# Patient Record
Sex: Male | Born: 1950
Health system: Southern US, Community
[De-identification: ages and names within clinical notes are randomized; demographics above are authoritative.]

## PROBLEM LIST (undated history)

## (undated) DIAGNOSIS — R7303 Prediabetes: Secondary | ICD-10-CM

## (undated) DIAGNOSIS — G5600 Carpal tunnel syndrome, unspecified upper limb: Secondary | ICD-10-CM

## (undated) DIAGNOSIS — E119 Type 2 diabetes mellitus without complications: Secondary | ICD-10-CM

## (undated) DIAGNOSIS — I10 Essential (primary) hypertension: Secondary | ICD-10-CM

## (undated) HISTORY — DX: Type 2 diabetes mellitus without complications: E11.9

---

## 2002-02-19 ENCOUNTER — Emergency Department (HOSPITAL_COMMUNITY): Admission: EM | Admit: 2002-02-19 | Discharge: 2002-02-19 | Payer: Self-pay | Admitting: Emergency Medicine

## 2003-07-28 ENCOUNTER — Ambulatory Visit (HOSPITAL_COMMUNITY): Admission: RE | Admit: 2003-07-28 | Discharge: 2003-07-28 | Payer: Self-pay | Admitting: Chiropractic Medicine

## 2006-02-18 HISTORY — PX: COLONOSCOPY: SHX174

## 2006-05-20 ENCOUNTER — Ambulatory Visit: Payer: Self-pay | Admitting: Gastroenterology

## 2006-06-03 ENCOUNTER — Ambulatory Visit: Payer: Self-pay | Admitting: Gastroenterology

## 2010-07-23 ENCOUNTER — Ambulatory Visit: Payer: Self-pay | Admitting: Gastroenterology

## 2010-10-13 ENCOUNTER — Encounter: Payer: Self-pay | Admitting: *Deleted

## 2010-10-13 DIAGNOSIS — M436 Torticollis: Secondary | ICD-10-CM | POA: Insufficient documentation

## 2010-10-13 DIAGNOSIS — M62838 Other muscle spasm: Secondary | ICD-10-CM | POA: Insufficient documentation

## 2010-10-13 NOTE — ED Notes (Signed)
Pt states that he went to a movie and came out, then his neck began to stiffen up and hurt. Now states he is having a hard "time swallowing". Denies recent illnesses

## 2010-10-14 ENCOUNTER — Emergency Department (INDEPENDENT_AMBULATORY_CARE_PROVIDER_SITE_OTHER): Payer: 59

## 2010-10-14 ENCOUNTER — Emergency Department (HOSPITAL_BASED_OUTPATIENT_CLINIC_OR_DEPARTMENT_OTHER)
Admission: EM | Admit: 2010-10-14 | Discharge: 2010-10-14 | Disposition: A | Discharge status: Home / Self Care | Payer: 59 | Attending: Emergency Medicine | Admitting: Emergency Medicine

## 2010-10-14 ENCOUNTER — Encounter (HOSPITAL_BASED_OUTPATIENT_CLINIC_OR_DEPARTMENT_OTHER): Payer: Self-pay | Admitting: *Deleted

## 2010-10-14 DIAGNOSIS — E119 Type 2 diabetes mellitus without complications: Secondary | ICD-10-CM

## 2010-10-14 DIAGNOSIS — M542 Cervicalgia: Secondary | ICD-10-CM

## 2010-10-14 DIAGNOSIS — M436 Torticollis: Secondary | ICD-10-CM

## 2010-10-14 DIAGNOSIS — J329 Chronic sinusitis, unspecified: Secondary | ICD-10-CM

## 2010-10-14 DIAGNOSIS — R930 Abnormal findings on diagnostic imaging of skull and head, not elsewhere classified: Secondary | ICD-10-CM

## 2010-10-14 DIAGNOSIS — M62838 Other muscle spasm: Secondary | ICD-10-CM

## 2010-10-14 HISTORY — DX: Prediabetes: R73.03

## 2010-10-14 LAB — BASIC METABOLIC PANEL
GFR calc non Af Amer: >60 mL/min (ref >60)
Glucose, Bld: 198 mg/dL — ABNORMAL HIGH (ref 70–99)
Potassium: 3.7 mEq/L (ref 3.5–5.1)

## 2010-10-14 MED ORDER — DIAZEPAM 5 MG PO TABS: 5.0000 mg | ORAL_TABLET | Freq: Two times a day (BID) | ORAL | Status: AC

## 2010-10-14 MED ORDER — IOHEXOL 300 MG/ML  SOLN
100.0000 mL | Freq: Once | INTRAMUSCULAR | Status: AC | PRN
  Administered 2010-10-14: 100 mL via INTRAVENOUS

## 2010-10-14 NOTE — ED Provider Notes (Addendum)
History     CSN: [id number] Arrival date & time: 10/14/2010  1:36 AM  Chief Complaint  Patient presents with  . Torticollis   HPI Comments: Pt went to a movie, soon after developed "pressure/stiffness" in his occiput and upper neck.  Reports pain is mild, and not sudden onset Denies trauma to neck No focal weakness. No difficulty speaking/swallowing Denies fever No visual changes  Patient is a 60 y.o. male presenting with headaches.  Headache  This is a new problem. The current episode started 1 to 2 hours ago. The problem occurs constantly. The pain is located in the occipital region. The quality of the pain is described as throbbing. The pain is mild. Radiates to: upper neck. Pertinent negatives include no anorexia, no fever, no malaise/fatigue, no chest pressure, no near-syncope, no syncope, no shortness of breath, no nausea and no vomiting. Associated symptoms comments: Denies cp/sob/weakness/visual changes/hearing change/slurred speech No frontal HA reported .    Past Medical History  Diagnosis Date  . Pre-diabetes     No past surgical history on file.  No family history on file.  History  Substance Use Topics  . Smoking status: Never Smoker   . Smokeless tobacco: Not on file  . Alcohol Use: No      Review of Systems  Constitutional: Negative for fever and malaise/fatigue.  Respiratory: Negative for shortness of breath.   Cardiovascular: Negative for syncope and near-syncope.  Gastrointestinal: Negative for nausea, vomiting and anorexia.  Neurological: Positive for headaches.  All other systems reviewed and are negative.    Physical Exam  BP 142/86  Pulse 76  Temp(Src) 98.5 F (36.9 C) (Oral)  Resp 20  SpO2 100%  Physical Exam  CONSTITUTIONAL: Well developed/well nourished HEAD AND FACE: Normocephalic/atraumatic but tender at occiput, no bruising noted EYES: EOMI/PERRL ENMT: Mucous membranes moist, no trismus, NECK: pt is able to flex/extend neck  but has limited ROM looking to left/right,  no carotid bruit SPINE:tender at C1, no bruising/crepitance CV: S1/S2 noted, no murmurs/rubs/gallops noted LUNGS: Lungs are clear to auscultation bilaterally, no apparent distress ABDOMEN: soft, nontender, no rebound or guarding NEURO: Awake/alert, facies symmetric, no arm or leg drift is noted Cranial nerves 3/4/5/6/08/26/08/11/12 tested and intact EXTREMITIES: pulses normal, full ROM SKIN: warm, color normal PSYCH: no abnormalities of mood noted   ED Course  Procedures  MDM Nursing notes reviewed and considered in documentation All labs/vitals reviewed and considered  Pt with insidious onset of head/neck pain.  Unclear cause.  No neuro deficits, but given history, will image brain/neck, and in particular focus on neck vasculature.   3:41 AM  5:29 AM ct head negative and ct neck shows no obvious abnormalities per prelim read.  Pt still with neck stiffness, but no other new complaints     Pt instructed to hold his metformin for 48hours, he is agreeable with this plan   Joya Gaskins, MD 10/14/10 [id number]  Joya Gaskins, MD 10/14/10 [phone]

## 2014-06-07 ENCOUNTER — Emergency Department (HOSPITAL_BASED_OUTPATIENT_CLINIC_OR_DEPARTMENT_OTHER): Admission: EM | Admit: 2014-06-07 | Discharge: 2014-06-07 | Discharge status: Absent without leave | Payer: Self-pay

## 2014-06-07 ENCOUNTER — Encounter (HOSPITAL_BASED_OUTPATIENT_CLINIC_OR_DEPARTMENT_OTHER): Payer: Self-pay | Admitting: Emergency Medicine

## 2014-06-07 HISTORY — DX: Carpal tunnel syndrome, unspecified upper limb: G56.00

## 2014-06-07 HISTORY — DX: Essential (primary) hypertension: I10
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
## 2015-04-24 ENCOUNTER — Encounter (INDEPENDENT_AMBULATORY_CARE_PROVIDER_SITE_OTHER): Payer: BLUE CROSS/BLUE SHIELD | Admitting: Ophthalmology

## 2015-04-24 DIAGNOSIS — H43813 Vitreous degeneration, bilateral: Secondary | ICD-10-CM

## 2015-04-24 DIAGNOSIS — D3131 Benign neoplasm of right choroid: Secondary | ICD-10-CM | POA: Diagnosis not present

## 2015-04-24 DIAGNOSIS — H2513 Age-related nuclear cataract, bilateral: Secondary | ICD-10-CM

## 2016-04-17 ENCOUNTER — Encounter: Payer: Self-pay | Admitting: Gastroenterology

## 2016-10-16 ENCOUNTER — Encounter: Payer: Self-pay | Admitting: Internal Medicine

## 2018-04-08 ENCOUNTER — Encounter: Payer: Self-pay | Admitting: Gastroenterology

## 2018-05-13 ENCOUNTER — Encounter: Payer: Self-pay | Admitting: Gastroenterology

## 2018-05-22 ENCOUNTER — Encounter: Payer: Self-pay | Admitting: Gastroenterology

## 2019-03-17 ENCOUNTER — Ambulatory Visit: Payer: Self-pay

## 2019-03-26 ENCOUNTER — Ambulatory Visit: Payer: Medicare PPO | Attending: Internal Medicine

## 2019-03-26 DIAGNOSIS — Z23 Encounter for immunization: Secondary | ICD-10-CM | POA: Insufficient documentation

## 2019-03-26 NOTE — Progress Notes (Signed)
   Covid-19 Vaccination Clinic  Name:  Darryl Dunlap    MRN: [id number] DOB: 1950-04-20  03/26/2019  [name] was observed post Covid-19 immunization for 15 minutes without incidence. He was provided with Vaccine Information Sheet and instruction to access the V-Safe system.   [name] was instructed to call 911 with any severe reactions post vaccine: Marland Kitchen Difficulty breathing  . Swelling of your face and throat  . A fast heartbeat  . A bad rash all over your body  . Dizziness and weakness    Immunizations Administered    Name Date Dose VIS Date Route   Pfizer COVID-19 Vaccine 03/26/2019 10:17 AM 0.3 mL 01/29/2019 Intramuscular   Manufacturer: [place]   Lot: [id number]   [place]: [id number]

## 2019-04-03 ENCOUNTER — Ambulatory Visit: Payer: Self-pay

## 2019-04-20 ENCOUNTER — Ambulatory Visit: Payer: Medicare PPO | Attending: Internal Medicine

## 2019-04-20 DIAGNOSIS — Z23 Encounter for immunization: Secondary | ICD-10-CM | POA: Insufficient documentation

## 2019-04-20 NOTE — Progress Notes (Signed)
   Covid-19 Vaccination Clinic  Name:  Darryl Dunlap    MRN: [id number] DOB: 1950-06-12  04/20/2019  Darryl Dunlap was observed post Covid-19 immunization for 15 minutes without incident. He was provided with Vaccine Information Sheet and instruction to access the V-Safe system.   Darryl Dunlap was instructed to call 911 with any severe reactions post vaccine: Marland Kitchen Difficulty breathing  . Swelling of face and throat  . A fast heartbeat  . A bad rash all over body  . Dizziness and weakness   Immunizations Administered    Name Date Dose VIS Date Route   Pfizer COVID-19 Vaccine 04/20/2019 11:10 AM 0.3 mL 01/29/2019 Intramuscular   Manufacturer: [place]   Lot: [id number]   [place]: [id number]

## 2019-06-10 DIAGNOSIS — E1149 Type 2 diabetes mellitus with other diabetic neurological complication: Secondary | ICD-10-CM | POA: Diagnosis not present

## 2019-06-15 DIAGNOSIS — C44622 Squamous cell carcinoma of skin of right upper limb, including shoulder: Secondary | ICD-10-CM | POA: Diagnosis not present

## 2019-06-15 DIAGNOSIS — L820 Inflamed seborrheic keratosis: Secondary | ICD-10-CM | POA: Diagnosis not present

## 2019-06-15 DIAGNOSIS — D225 Melanocytic nevi of trunk: Secondary | ICD-10-CM | POA: Diagnosis not present

## 2019-06-15 DIAGNOSIS — D485 Neoplasm of uncertain behavior of skin: Secondary | ICD-10-CM | POA: Diagnosis not present

## 2019-06-15 DIAGNOSIS — Z1283 Encounter for screening for malignant neoplasm of skin: Secondary | ICD-10-CM | POA: Diagnosis not present

## 2019-06-16 DIAGNOSIS — L821 Other seborrheic keratosis: Secondary | ICD-10-CM | POA: Diagnosis not present

## 2019-06-16 DIAGNOSIS — D225 Melanocytic nevi of trunk: Secondary | ICD-10-CM | POA: Diagnosis not present

## 2019-06-16 DIAGNOSIS — C44622 Squamous cell carcinoma of skin of right upper limb, including shoulder: Secondary | ICD-10-CM | POA: Diagnosis not present

## 2019-07-09 DIAGNOSIS — D692 Other nonthrombocytopenic purpura: Secondary | ICD-10-CM | POA: Diagnosis not present

## 2019-07-09 DIAGNOSIS — Z1331 Encounter for screening for depression: Secondary | ICD-10-CM | POA: Diagnosis not present

## 2019-07-09 DIAGNOSIS — G629 Polyneuropathy, unspecified: Secondary | ICD-10-CM | POA: Diagnosis not present

## 2019-07-09 DIAGNOSIS — M47892 Other spondylosis, cervical region: Secondary | ICD-10-CM | POA: Diagnosis not present

## 2019-07-09 DIAGNOSIS — E7800 Pure hypercholesterolemia, unspecified: Secondary | ICD-10-CM | POA: Diagnosis not present

## 2019-07-09 DIAGNOSIS — E1149 Type 2 diabetes mellitus with other diabetic neurological complication: Secondary | ICD-10-CM | POA: Diagnosis not present

## 2019-07-09 DIAGNOSIS — Z794 Long term (current) use of insulin: Secondary | ICD-10-CM | POA: Diagnosis not present

## 2019-07-12 DIAGNOSIS — E1149 Type 2 diabetes mellitus with other diabetic neurological complication: Secondary | ICD-10-CM | POA: Diagnosis not present

## 2019-08-12 DIAGNOSIS — E1149 Type 2 diabetes mellitus with other diabetic neurological complication: Secondary | ICD-10-CM | POA: Diagnosis not present

## 2019-08-25 DIAGNOSIS — L570 Actinic keratosis: Secondary | ICD-10-CM | POA: Diagnosis not present

## 2019-08-25 DIAGNOSIS — L821 Other seborrheic keratosis: Secondary | ICD-10-CM | POA: Diagnosis not present

## 2019-08-25 DIAGNOSIS — Z08 Encounter for follow-up examination after completed treatment for malignant neoplasm: Secondary | ICD-10-CM | POA: Diagnosis not present

## 2019-08-25 DIAGNOSIS — X32XXXD Exposure to sunlight, subsequent encounter: Secondary | ICD-10-CM | POA: Diagnosis not present

## 2019-08-25 DIAGNOSIS — C44722 Squamous cell carcinoma of skin of right lower limb, including hip: Secondary | ICD-10-CM | POA: Diagnosis not present

## 2019-08-25 DIAGNOSIS — Z85828 Personal history of other malignant neoplasm of skin: Secondary | ICD-10-CM | POA: Diagnosis not present

## 2019-09-13 DIAGNOSIS — E1149 Type 2 diabetes mellitus with other diabetic neurological complication: Secondary | ICD-10-CM | POA: Diagnosis not present

## 2019-09-28 DIAGNOSIS — Z0000 Encounter for general adult medical examination without abnormal findings: Secondary | ICD-10-CM | POA: Diagnosis not present

## 2019-09-28 DIAGNOSIS — E1149 Type 2 diabetes mellitus with other diabetic neurological complication: Secondary | ICD-10-CM | POA: Diagnosis not present

## 2019-09-28 DIAGNOSIS — Z125 Encounter for screening for malignant neoplasm of prostate: Secondary | ICD-10-CM | POA: Diagnosis not present

## 2019-09-28 DIAGNOSIS — E7800 Pure hypercholesterolemia, unspecified: Secondary | ICD-10-CM | POA: Diagnosis not present

## 2019-10-05 DIAGNOSIS — Z0000 Encounter for general adult medical examination without abnormal findings: Secondary | ICD-10-CM | POA: Diagnosis not present

## 2019-10-05 DIAGNOSIS — D692 Other nonthrombocytopenic purpura: Secondary | ICD-10-CM | POA: Diagnosis not present

## 2019-10-05 DIAGNOSIS — Z794 Long term (current) use of insulin: Secondary | ICD-10-CM | POA: Diagnosis not present

## 2019-10-05 DIAGNOSIS — R82998 Other abnormal findings in urine: Secondary | ICD-10-CM | POA: Diagnosis not present

## 2019-10-05 DIAGNOSIS — Z1212 Encounter for screening for malignant neoplasm of rectum: Secondary | ICD-10-CM | POA: Diagnosis not present

## 2019-10-05 DIAGNOSIS — E7800 Pure hypercholesterolemia, unspecified: Secondary | ICD-10-CM | POA: Diagnosis not present

## 2019-10-05 DIAGNOSIS — E1149 Type 2 diabetes mellitus with other diabetic neurological complication: Secondary | ICD-10-CM | POA: Diagnosis not present

## 2019-10-05 DIAGNOSIS — M47892 Other spondylosis, cervical region: Secondary | ICD-10-CM | POA: Diagnosis not present

## 2019-10-05 DIAGNOSIS — G629 Polyneuropathy, unspecified: Secondary | ICD-10-CM | POA: Diagnosis not present

## 2019-10-14 DIAGNOSIS — E1149 Type 2 diabetes mellitus with other diabetic neurological complication: Secondary | ICD-10-CM | POA: Diagnosis not present

## 2019-11-15 DIAGNOSIS — E1149 Type 2 diabetes mellitus with other diabetic neurological complication: Secondary | ICD-10-CM | POA: Diagnosis not present

## 2019-11-30 ENCOUNTER — Ambulatory Visit: Payer: Medicare PPO | Admitting: Family Medicine

## 2019-12-15 DIAGNOSIS — E1149 Type 2 diabetes mellitus with other diabetic neurological complication: Secondary | ICD-10-CM | POA: Diagnosis not present

## 2019-12-16 ENCOUNTER — Ambulatory Visit: Payer: Medicare PPO | Admitting: Family Medicine

## 2019-12-16 ENCOUNTER — Encounter: Payer: Self-pay | Admitting: Family Medicine

## 2019-12-16 ENCOUNTER — Other Ambulatory Visit: Payer: Self-pay

## 2019-12-16 ENCOUNTER — Ambulatory Visit (INDEPENDENT_AMBULATORY_CARE_PROVIDER_SITE_OTHER): Payer: Medicare PPO

## 2019-12-16 VITALS — BP 120/64 | HR 79 | Ht 71.0 in | Wt 174.0 lb

## 2019-12-16 DIAGNOSIS — G8929 Other chronic pain: Secondary | ICD-10-CM

## 2019-12-16 DIAGNOSIS — M6289 Other specified disorders of muscle: Secondary | ICD-10-CM | POA: Insufficient documentation

## 2019-12-16 DIAGNOSIS — M25561 Pain in right knee: Secondary | ICD-10-CM

## 2019-12-16 DIAGNOSIS — M542 Cervicalgia: Secondary | ICD-10-CM

## 2019-12-16 DIAGNOSIS — M5030 Other cervical disc degeneration, unspecified cervical region: Secondary | ICD-10-CM | POA: Diagnosis not present

## 2019-12-16 DIAGNOSIS — M1711 Unilateral primary osteoarthritis, right knee: Secondary | ICD-10-CM | POA: Diagnosis not present

## 2019-12-16 NOTE — Progress Notes (Signed)
Darryl Dunlap Phone: [phone] Subjective:   Darryl Dunlap, am serving as a scribe for Dr. Hulan Saas. This visit occurred during the SARS-CoV-2 public health emergency.  Safety protocols were in place, including screening questions prior to the visit, additional usage of staff PPE, and extensive cleaning of exam room while observing appropriate contact time as indicated for disinfecting solutions.   I'm seeing this patient by the request  of:  Patient, Dunlap Pcp Per  CC: Neck pain  AQT:[id number]  Darryl Dunlap is a 69 y.o. male coming in with complaint of neck pain. Patient states that he woke up in January with a muscle spasm in left side of cervical spine. Patient feels like he has lost ROM as muscle has stayed tight. Does get headaches from tightness. Feels a popping sensation in his neck with rotation. Has had 2 other spasms since the one in January that subsided within a few days.   Also having right knee pain over medial aspect. Walks carrying golf bag. Feels like knee is going to give out when he takes a step.  Patient denies any injury to the knee.  Patient has had difficulty with lower back and hamstring pain previously.      Past Medical History:  Diagnosis Date  . Carpal tunnel syndrome   . Hypertension   . Pre-diabetes    Dunlap past surgical history on file. Social History   Socioeconomic History  . Marital status: Married    Spouse name: Not on file  . Number of children: Not on file  . Years of education: Not on file  . Highest education level: Not on file  Occupational History  . Not on file  Tobacco Use  . Smoking status: Never Smoker  . Smokeless tobacco: Never Used  Substance and Sexual Activity  . Alcohol use: Dunlap  . Drug use: Dunlap  . Sexual activity: Not on file  Other Topics Concern  . Not on file  Social History Narrative  . Not on file   Social Determinants of Health    Financial Resource Strain:   . Difficulty of Paying Living Expenses: Not on file  Food Insecurity:   . Worried About Charity fundraiser in the Last Year: Not on file  . Ran Out of Food in the Last Year: Not on file  Transportation Needs:   . Lack of Transportation (Medical): Not on file  . Lack of Transportation (Non-Medical): Not on file  Physical Activity:   . Days of Exercise per Week: Not on file  . Minutes of Exercise per Session: Not on file  Stress:   . Feeling of Stress : Not on file  Social Connections:   . Frequency of Communication with Friends and Family: Not on file  . Frequency of Social Gatherings with Friends and Family: Not on file  . Attends Religious Services: Not on file  . Active Member of Clubs or Organizations: Not on file  . Attends Archivist Meetings: Not on file  . Marital Status: Not on file   Allergies  Allergen Reactions  . Penicillins    Dunlap family history on file.  Current Outpatient Medications (Endocrine & Metabolic):  .  metFORMIN (GLUCOPHAGE) 500 MG tablet, Take 500 mg by mouth 2 (two) times daily with a meal.          Reviewed prior external information including notes and imaging from  primary care provider  As well as notes that were available from care everywhere and other healthcare systems.  Past medical history, social, surgical and family history all reviewed in electronic medical record.  Dunlap pertanent information unless stated regarding to the chief complaint.   Review of Systems:  Dunlap headache, visual changes, nausea, vomiting, diarrhea, constipation, dizziness, abdominal pain, skin rash, fevers, chills, night sweats, weight loss, swollen lymph nodes, body aches, joint swelling, chest pain, shortness of breath, mood changes. POSITIVE muscle aches  Objective  Blood pressure 120/64, pulse 79, height 5\' 11"  (1.803 m), weight 174 lb (78.9 kg), SpO2 95 %.   General: Dunlap apparent distress alert and oriented x3 mood and  affect normal, dressed appropriately.  HEENT: Pupils equal, extraocular movements intact  Respiratory: Patient's speak in full sentences and does not appear short of breath  Cardiovascular: Dunlap lower extremity edema, non tender, Dunlap erythema  Gait antalgic MSK:   Neck exam shows the patient has significant loss of range of motion in all planes.  Patient lacks last 5 degrees of flexion has only 5 degrees of extension.  Very minimal sidebending bilaterally.  Patient also has limited rotation to the left of his neck.  Mild crepitus noted.  Unable to even do Spurling's secondary to tightness.  5-5 strength of the upper extremities.  Deep tendon reflexes do appear to be intact of the upper extremities.  Right knee exam shows the patient does have tenderness to palpation over the medial aspect of the right knee.  Does have some tightness of the hamstring on the right greater than left.  Mild pain over the Pes anserine area.  Negative McMurray's noted today.  ACL appears to be intact  [id number]; 15 additional minutes spent for Therapeutic exercises as stated in above notes.  This included exercises focusing on stretching, strengthening, with significant focus on eccentric aspects.   Long term goals include an improvement in range of motion, strength, endurance as well as avoiding reinjury. Patient's frequency would include in 1-2 times a day, 3-5 times a week for a duration of 6-12 weeks. Exercises that included:  Basic scapular stabilization to include adduction and depression of scapula Scaption, focusing on proper movement and good control Internal and External rotation utilizing a theraband, with elbow tucked at side entire time Rows with theraband    Proper technique shown and discussed handout in great detail with ATC.  All questions were discussed and answered.     Impression and Recommendations:     The above documentation has been reviewed and is accurate and complete Lyndal Pulley, DO

## 2019-12-16 NOTE — Patient Instructions (Signed)
Xray today Exercises 3x a week Thigh compression sleeve Voltaren gel Tart cherry extract 1200mg  at night See me in 6 weeks

## 2019-12-16 NOTE — Assessment & Plan Note (Signed)
Degenerative disc of the cervical spine.  Patient's x-rays are pending discussed icing regimen and home exercises.  Patient was avoid a lot of prescription medications.  Discussed posture and ergonomics.  Patient work with Product/process development scientist.  Will work on English as a second language teacher.  Follow-up again in 6 weeks.  Could be a candidate for manipulation.

## 2019-12-16 NOTE — Assessment & Plan Note (Signed)
Patient does have more of a hamstring tightness on the right side.  We will get knee x-rays to further evaluate any type of arthritic changes that could be contributing as well.  We discussed with patient about exercises.  Patient had been walking previously.  If patient continues to have more of the instability will consider ultrasound and follow-up in 6

## 2020-01-04 DIAGNOSIS — C44629 Squamous cell carcinoma of skin of left upper limb, including shoulder: Secondary | ICD-10-CM | POA: Diagnosis not present

## 2020-01-04 DIAGNOSIS — B078 Other viral warts: Secondary | ICD-10-CM | POA: Diagnosis not present

## 2020-01-04 DIAGNOSIS — L255 Unspecified contact dermatitis due to plants, except food: Secondary | ICD-10-CM | POA: Diagnosis not present

## 2020-01-17 DIAGNOSIS — E1149 Type 2 diabetes mellitus with other diabetic neurological complication: Secondary | ICD-10-CM | POA: Diagnosis not present

## 2020-01-19 DIAGNOSIS — Z85828 Personal history of other malignant neoplasm of skin: Secondary | ICD-10-CM | POA: Diagnosis not present

## 2020-01-19 DIAGNOSIS — B078 Other viral warts: Secondary | ICD-10-CM | POA: Diagnosis not present

## 2020-01-19 DIAGNOSIS — Z08 Encounter for follow-up examination after completed treatment for malignant neoplasm: Secondary | ICD-10-CM | POA: Diagnosis not present

## 2020-01-27 ENCOUNTER — Ambulatory Visit: Payer: Medicare PPO | Admitting: Family Medicine

## 2020-02-04 DIAGNOSIS — G629 Polyneuropathy, unspecified: Secondary | ICD-10-CM | POA: Diagnosis not present

## 2020-02-04 DIAGNOSIS — E7800 Pure hypercholesterolemia, unspecified: Secondary | ICD-10-CM | POA: Diagnosis not present

## 2020-02-04 DIAGNOSIS — Z794 Long term (current) use of insulin: Secondary | ICD-10-CM | POA: Diagnosis not present

## 2020-02-04 DIAGNOSIS — E1149 Type 2 diabetes mellitus with other diabetic neurological complication: Secondary | ICD-10-CM | POA: Diagnosis not present

## 2020-02-04 DIAGNOSIS — D692 Other nonthrombocytopenic purpura: Secondary | ICD-10-CM | POA: Diagnosis not present

## 2020-02-04 DIAGNOSIS — M47892 Other spondylosis, cervical region: Secondary | ICD-10-CM | POA: Diagnosis not present

## 2020-02-16 DIAGNOSIS — E1149 Type 2 diabetes mellitus with other diabetic neurological complication: Secondary | ICD-10-CM | POA: Diagnosis not present

## 2020-02-28 NOTE — Progress Notes (Signed)
Darryl Dunlap [address] Darryl Dunlap Darryl Dunlap Phone: [phone] Subjective:   I Darryl Dunlap am serving as a Education administrator for Dr. Hulan Saas.  This visit occurred during the SARS-CoV-2 public health emergency.  Safety protocols were in place, including screening questions prior to the visit, additional usage of staff PPE, and extensive cleaning of exam room while observing appropriate contact time as indicated for disinfecting solutions.   I'm seeing this patient by the request  of:  Patient, No Pcp Per  CC: knee and neck pain follow up   EPP:[id number]   12/16/2019 Patient does have more of a hamstring tightness on the right side.  We will get knee x-rays to further evaluate any type of arthritic changes that could be contributing as well.  We discussed with patient about exercises.  Patient had been walking previously.  If patient continues to have more of the instability will consider ultrasound and follow-up in 6  Degenerative disc of the cervical spine.  Patient's x-rays are pending discussed icing regimen and home exercises.  Patient was avoid a lot of prescription medications.  Discussed posture and ergonomics.  Patient work with Product/process development scientist.  Will work on English as a second language teacher.  Follow-up again in 6 weeks.  Could be a candidate for manipulation.  Update 02/29/2020 Darryl Dunlap is a 70 y.o. male coming in with complaint of neck pain. States he is making progress but still some stiffness. Neck still pops but not as bad. Mainly with flexion and rotation.    Xray cervical 12/16/2019 IMPRESSION: Negative for acute fracture or malalignment of the cervical spine.  Early degenerative changes.  Xray R knee 12/16/2019 IMPRESSION: Negative for acute bony abnormality, with minimal degenerative changes     Past Medical History:  Diagnosis Date   Carpal tunnel syndrome    Hypertension    Pre-diabetes    No past surgical history on  file. Social History   Socioeconomic History   Marital status: Married    Spouse name: Not on file   Number of children: Not on file   Years of education: Not on file   Highest education level: Not on file  Occupational History   Not on file  Tobacco Use   Smoking status: Never Smoker   Smokeless tobacco: Never Used  Substance and Sexual Activity   Alcohol use: No   Drug use: No   Sexual activity: Not on file  Other Topics Concern   Not on file  Social History Narrative   Not on file   Social Determinants of Health   Financial Resource Strain: Not on file  Food Insecurity: Not on file  Transportation Needs: Not on file  Physical Activity: Not on file  Stress: Not on file  Social Connections: Not on file   Allergies  Allergen Reactions   Penicillins    No family history on file.  Current Outpatient Medications (Endocrine & Metabolic):    metFORMIN (GLUCOPHAGE) 500 MG tablet, Take 500 mg by mouth 2 (two) times daily with a meal.        Reviewed prior external information including notes and imaging from  primary care provider As well as notes that were available from care everywhere and other healthcare systems.  Past medical history, social, surgical and family history all reviewed in electronic medical record.  No pertanent information unless stated regarding to the chief complaint.   Review of Systems:  No headache, visual changes, nausea, vomiting, diarrhea, constipation, dizziness, abdominal pain,  skin rash, fevers, chills, night sweats, weight loss, swollen lymph nodes, body aches, joint swelling, chest pain, shortness of breath, mood changes. POSITIVE muscle aches  Objective  Blood pressure 130/80, pulse 61, height 5\' 11"  (1.803 m), weight 172 lb (78 kg), SpO2 98 %.   General: No apparent distress alert and oriented x3 mood and affect normal, dressed appropriately.  HEENT: Pupils equal, extraocular movements intact  Respiratory:  Patient's speak in full sentences and does not appear short of breath  Cardiovascular: No lower extremity edema, non tender, no erythema  Gait normal MSK: Neck exam shows the patient does have some mild loss of lordosis.  Still lacks the last 5 degrees of extension.  Patient actually has more tightness now with rotation to the right and sidebending to the left.  Negative Spurling's.    Impression and Recommendations:     The above documentation has been reviewed and is accurate and complete Lyndal Pulley, DO

## 2020-02-29 ENCOUNTER — Encounter: Payer: Self-pay | Admitting: Family Medicine

## 2020-02-29 ENCOUNTER — Other Ambulatory Visit: Payer: Self-pay

## 2020-02-29 ENCOUNTER — Ambulatory Visit: Payer: Medicare PPO | Admitting: Family Medicine

## 2020-02-29 DIAGNOSIS — M5030 Other cervical disc degeneration, unspecified cervical region: Secondary | ICD-10-CM

## 2020-02-29 NOTE — Patient Instructions (Signed)
Good to see you Lets try to do the exercises regularly Work on posture especially with reading Consider music stand See me again in 7-8 weeks

## 2020-02-29 NOTE — Assessment & Plan Note (Signed)
Patient has very mild degenerative disc disease and I do think most of patient's pain is secondary to more of the tightness of the musculature now that we have the x-rays.  Discussed with patient in great length and patient is going to continue with the range of motion.  Patient wanted to hold on any manipulation or physical therapy.  We will check in again in 8 weeks and see how patient is doing.

## 2020-03-20 DIAGNOSIS — E1149 Type 2 diabetes mellitus with other diabetic neurological complication: Secondary | ICD-10-CM | POA: Diagnosis not present

## 2020-04-19 DIAGNOSIS — E1149 Type 2 diabetes mellitus with other diabetic neurological complication: Secondary | ICD-10-CM | POA: Diagnosis not present

## 2020-05-01 DIAGNOSIS — H5213 Myopia, bilateral: Secondary | ICD-10-CM | POA: Diagnosis not present

## 2020-05-22 DIAGNOSIS — E1149 Type 2 diabetes mellitus with other diabetic neurological complication: Secondary | ICD-10-CM | POA: Diagnosis not present

## 2020-06-13 DIAGNOSIS — D692 Other nonthrombocytopenic purpura: Secondary | ICD-10-CM | POA: Diagnosis not present

## 2020-06-13 DIAGNOSIS — G629 Polyneuropathy, unspecified: Secondary | ICD-10-CM | POA: Diagnosis not present

## 2020-06-13 DIAGNOSIS — M47892 Other spondylosis, cervical region: Secondary | ICD-10-CM | POA: Diagnosis not present

## 2020-06-13 DIAGNOSIS — Z794 Long term (current) use of insulin: Secondary | ICD-10-CM | POA: Diagnosis not present

## 2020-06-13 DIAGNOSIS — E1149 Type 2 diabetes mellitus with other diabetic neurological complication: Secondary | ICD-10-CM | POA: Diagnosis not present

## 2020-06-13 DIAGNOSIS — E7800 Pure hypercholesterolemia, unspecified: Secondary | ICD-10-CM | POA: Diagnosis not present

## 2020-06-21 DIAGNOSIS — E1149 Type 2 diabetes mellitus with other diabetic neurological complication: Secondary | ICD-10-CM | POA: Diagnosis not present

## 2020-06-30 DIAGNOSIS — L308 Other specified dermatitis: Secondary | ICD-10-CM | POA: Diagnosis not present

## 2020-06-30 DIAGNOSIS — D485 Neoplasm of uncertain behavior of skin: Secondary | ICD-10-CM | POA: Diagnosis not present

## 2020-06-30 DIAGNOSIS — L570 Actinic keratosis: Secondary | ICD-10-CM | POA: Diagnosis not present

## 2020-06-30 DIAGNOSIS — Z1283 Encounter for screening for malignant neoplasm of skin: Secondary | ICD-10-CM | POA: Diagnosis not present

## 2020-06-30 DIAGNOSIS — D225 Melanocytic nevi of trunk: Secondary | ICD-10-CM | POA: Diagnosis not present

## 2020-06-30 DIAGNOSIS — X32XXXD Exposure to sunlight, subsequent encounter: Secondary | ICD-10-CM | POA: Diagnosis not present

## 2020-07-19 DIAGNOSIS — D485 Neoplasm of uncertain behavior of skin: Secondary | ICD-10-CM | POA: Diagnosis not present

## 2020-07-19 DIAGNOSIS — D2261 Melanocytic nevi of right upper limb, including shoulder: Secondary | ICD-10-CM | POA: Diagnosis not present

## 2020-07-24 DIAGNOSIS — E1149 Type 2 diabetes mellitus with other diabetic neurological complication: Secondary | ICD-10-CM | POA: Diagnosis not present

## 2020-08-23 DIAGNOSIS — E1149 Type 2 diabetes mellitus with other diabetic neurological complication: Secondary | ICD-10-CM | POA: Diagnosis not present

## 2020-09-22 DIAGNOSIS — E1149 Type 2 diabetes mellitus with other diabetic neurological complication: Secondary | ICD-10-CM | POA: Diagnosis not present

## 2020-10-24 DIAGNOSIS — E7800 Pure hypercholesterolemia, unspecified: Secondary | ICD-10-CM | POA: Diagnosis not present

## 2020-10-24 DIAGNOSIS — E1149 Type 2 diabetes mellitus with other diabetic neurological complication: Secondary | ICD-10-CM | POA: Diagnosis not present

## 2020-10-24 DIAGNOSIS — Z125 Encounter for screening for malignant neoplasm of prostate: Secondary | ICD-10-CM | POA: Diagnosis not present

## 2020-10-31 DIAGNOSIS — D692 Other nonthrombocytopenic purpura: Secondary | ICD-10-CM | POA: Diagnosis not present

## 2020-10-31 DIAGNOSIS — F5102 Adjustment insomnia: Secondary | ICD-10-CM | POA: Diagnosis not present

## 2020-10-31 DIAGNOSIS — E7800 Pure hypercholesterolemia, unspecified: Secondary | ICD-10-CM | POA: Diagnosis not present

## 2020-10-31 DIAGNOSIS — R82998 Other abnormal findings in urine: Secondary | ICD-10-CM | POA: Diagnosis not present

## 2020-10-31 DIAGNOSIS — Z794 Long term (current) use of insulin: Secondary | ICD-10-CM | POA: Diagnosis not present

## 2020-10-31 DIAGNOSIS — Z1212 Encounter for screening for malignant neoplasm of rectum: Secondary | ICD-10-CM | POA: Diagnosis not present

## 2020-10-31 DIAGNOSIS — E1149 Type 2 diabetes mellitus with other diabetic neurological complication: Secondary | ICD-10-CM | POA: Diagnosis not present

## 2020-10-31 DIAGNOSIS — G629 Polyneuropathy, unspecified: Secondary | ICD-10-CM | POA: Diagnosis not present

## 2020-10-31 DIAGNOSIS — Z0000 Encounter for general adult medical examination without abnormal findings: Secondary | ICD-10-CM | POA: Diagnosis not present

## 2020-10-31 DIAGNOSIS — M47892 Other spondylosis, cervical region: Secondary | ICD-10-CM | POA: Diagnosis not present

## 2020-11-23 DIAGNOSIS — E1149 Type 2 diabetes mellitus with other diabetic neurological complication: Secondary | ICD-10-CM | POA: Diagnosis not present

## 2020-12-25 DIAGNOSIS — E1149 Type 2 diabetes mellitus with other diabetic neurological complication: Secondary | ICD-10-CM | POA: Diagnosis not present

## 2021-01-24 DIAGNOSIS — E1149 Type 2 diabetes mellitus with other diabetic neurological complication: Secondary | ICD-10-CM | POA: Diagnosis not present

## 2021-02-23 DIAGNOSIS — E1149 Type 2 diabetes mellitus with other diabetic neurological complication: Secondary | ICD-10-CM | POA: Diagnosis not present

## 2021-03-13 DIAGNOSIS — E7800 Pure hypercholesterolemia, unspecified: Secondary | ICD-10-CM | POA: Diagnosis not present

## 2021-03-13 DIAGNOSIS — Z794 Long term (current) use of insulin: Secondary | ICD-10-CM | POA: Diagnosis not present

## 2021-03-13 DIAGNOSIS — G629 Polyneuropathy, unspecified: Secondary | ICD-10-CM | POA: Diagnosis not present

## 2021-03-13 DIAGNOSIS — D692 Other nonthrombocytopenic purpura: Secondary | ICD-10-CM | POA: Diagnosis not present

## 2021-03-13 DIAGNOSIS — E1149 Type 2 diabetes mellitus with other diabetic neurological complication: Secondary | ICD-10-CM | POA: Diagnosis not present

## 2021-03-13 DIAGNOSIS — Z1339 Encounter for screening examination for other mental health and behavioral disorders: Secondary | ICD-10-CM | POA: Diagnosis not present

## 2021-03-13 DIAGNOSIS — M47892 Other spondylosis, cervical region: Secondary | ICD-10-CM | POA: Diagnosis not present

## 2021-03-13 DIAGNOSIS — E1140 Type 2 diabetes mellitus with diabetic neuropathy, unspecified: Secondary | ICD-10-CM | POA: Diagnosis not present

## 2021-03-13 DIAGNOSIS — Z1331 Encounter for screening for depression: Secondary | ICD-10-CM | POA: Diagnosis not present

## 2021-03-13 DIAGNOSIS — F5102 Adjustment insomnia: Secondary | ICD-10-CM | POA: Diagnosis not present

## 2021-03-26 DIAGNOSIS — E1149 Type 2 diabetes mellitus with other diabetic neurological complication: Secondary | ICD-10-CM | POA: Diagnosis not present

## 2021-05-24 DIAGNOSIS — E1149 Type 2 diabetes mellitus with other diabetic neurological complication: Secondary | ICD-10-CM | POA: Diagnosis not present

## 2021-07-05 DIAGNOSIS — Z794 Long term (current) use of insulin: Secondary | ICD-10-CM | POA: Diagnosis not present

## 2021-07-05 DIAGNOSIS — E7800 Pure hypercholesterolemia, unspecified: Secondary | ICD-10-CM | POA: Diagnosis not present

## 2021-07-05 DIAGNOSIS — D692 Other nonthrombocytopenic purpura: Secondary | ICD-10-CM | POA: Diagnosis not present

## 2021-07-05 DIAGNOSIS — E1140 Type 2 diabetes mellitus with diabetic neuropathy, unspecified: Secondary | ICD-10-CM | POA: Diagnosis not present

## 2021-07-05 DIAGNOSIS — G629 Polyneuropathy, unspecified: Secondary | ICD-10-CM | POA: Diagnosis not present

## 2021-07-05 DIAGNOSIS — M47892 Other spondylosis, cervical region: Secondary | ICD-10-CM | POA: Diagnosis not present

## 2021-07-05 DIAGNOSIS — F5102 Adjustment insomnia: Secondary | ICD-10-CM | POA: Diagnosis not present

## 2021-08-23 DIAGNOSIS — E1149 Type 2 diabetes mellitus with other diabetic neurological complication: Secondary | ICD-10-CM | POA: Diagnosis not present

## 2021-09-18 DIAGNOSIS — E1140 Type 2 diabetes mellitus with diabetic neuropathy, unspecified: Secondary | ICD-10-CM | POA: Diagnosis not present

## 2021-09-18 DIAGNOSIS — E7800 Pure hypercholesterolemia, unspecified: Secondary | ICD-10-CM | POA: Diagnosis not present

## 2021-09-18 DIAGNOSIS — Z794 Long term (current) use of insulin: Secondary | ICD-10-CM | POA: Diagnosis not present

## 2021-11-08 DIAGNOSIS — E1149 Type 2 diabetes mellitus with other diabetic neurological complication: Secondary | ICD-10-CM | POA: Diagnosis not present

## 2021-11-08 DIAGNOSIS — E7800 Pure hypercholesterolemia, unspecified: Secondary | ICD-10-CM | POA: Diagnosis not present

## 2021-11-08 DIAGNOSIS — Z125 Encounter for screening for malignant neoplasm of prostate: Secondary | ICD-10-CM | POA: Diagnosis not present

## 2021-11-08 DIAGNOSIS — R7989 Other specified abnormal findings of blood chemistry: Secondary | ICD-10-CM | POA: Diagnosis not present

## 2021-11-15 DIAGNOSIS — N529 Male erectile dysfunction, unspecified: Secondary | ICD-10-CM | POA: Diagnosis not present

## 2021-11-15 DIAGNOSIS — Z1331 Encounter for screening for depression: Secondary | ICD-10-CM | POA: Diagnosis not present

## 2021-11-15 DIAGNOSIS — E7800 Pure hypercholesterolemia, unspecified: Secondary | ICD-10-CM | POA: Diagnosis not present

## 2021-11-15 DIAGNOSIS — R82998 Other abnormal findings in urine: Secondary | ICD-10-CM | POA: Diagnosis not present

## 2021-11-15 DIAGNOSIS — Z23 Encounter for immunization: Secondary | ICD-10-CM | POA: Diagnosis not present

## 2021-11-15 DIAGNOSIS — F5102 Adjustment insomnia: Secondary | ICD-10-CM | POA: Diagnosis not present

## 2021-11-15 DIAGNOSIS — M47892 Other spondylosis, cervical region: Secondary | ICD-10-CM | POA: Diagnosis not present

## 2021-11-15 DIAGNOSIS — D692 Other nonthrombocytopenic purpura: Secondary | ICD-10-CM | POA: Diagnosis not present

## 2021-11-15 DIAGNOSIS — Z0000 Encounter for general adult medical examination without abnormal findings: Secondary | ICD-10-CM | POA: Diagnosis not present

## 2021-11-15 DIAGNOSIS — G629 Polyneuropathy, unspecified: Secondary | ICD-10-CM | POA: Diagnosis not present

## 2021-11-15 DIAGNOSIS — Z794 Long term (current) use of insulin: Secondary | ICD-10-CM | POA: Diagnosis not present

## 2021-11-15 DIAGNOSIS — E1140 Type 2 diabetes mellitus with diabetic neuropathy, unspecified: Secondary | ICD-10-CM | POA: Diagnosis not present

## 2021-11-15 DIAGNOSIS — Z1339 Encounter for screening examination for other mental health and behavioral disorders: Secondary | ICD-10-CM | POA: Diagnosis not present

## 2021-11-21 DIAGNOSIS — E1149 Type 2 diabetes mellitus with other diabetic neurological complication: Secondary | ICD-10-CM | POA: Diagnosis not present

## 2021-12-27 DIAGNOSIS — E1140 Type 2 diabetes mellitus with diabetic neuropathy, unspecified: Secondary | ICD-10-CM | POA: Diagnosis not present

## 2021-12-27 DIAGNOSIS — Z794 Long term (current) use of insulin: Secondary | ICD-10-CM | POA: Diagnosis not present

## 2021-12-27 DIAGNOSIS — E7800 Pure hypercholesterolemia, unspecified: Secondary | ICD-10-CM | POA: Diagnosis not present

## 2022-02-27 DIAGNOSIS — E1149 Type 2 diabetes mellitus with other diabetic neurological complication: Secondary | ICD-10-CM | POA: Diagnosis not present

## 2022-03-07 DIAGNOSIS — M47892 Other spondylosis, cervical region: Secondary | ICD-10-CM | POA: Diagnosis not present

## 2022-03-07 DIAGNOSIS — D692 Other nonthrombocytopenic purpura: Secondary | ICD-10-CM | POA: Diagnosis not present

## 2022-03-07 DIAGNOSIS — N529 Male erectile dysfunction, unspecified: Secondary | ICD-10-CM | POA: Diagnosis not present

## 2022-03-07 DIAGNOSIS — E7800 Pure hypercholesterolemia, unspecified: Secondary | ICD-10-CM | POA: Diagnosis not present

## 2022-03-07 DIAGNOSIS — E1140 Type 2 diabetes mellitus with diabetic neuropathy, unspecified: Secondary | ICD-10-CM | POA: Diagnosis not present

## 2022-03-07 DIAGNOSIS — G629 Polyneuropathy, unspecified: Secondary | ICD-10-CM | POA: Diagnosis not present

## 2022-03-07 DIAGNOSIS — F5102 Adjustment insomnia: Secondary | ICD-10-CM | POA: Diagnosis not present

## 2022-03-07 DIAGNOSIS — Z794 Long term (current) use of insulin: Secondary | ICD-10-CM | POA: Diagnosis not present

## 2022-03-16 IMAGING — DX DG CERVICAL SPINE 2 OR 3 VIEWS
3 series · 3 of 3 positions shown · non-contrast
Comparison: None.

CLINICAL DATA: 69-year-old male with cervical spine pain for 9
months

EXAM:
CERVICAL SPINE - 2-3 VIEW

[c-spine lat]
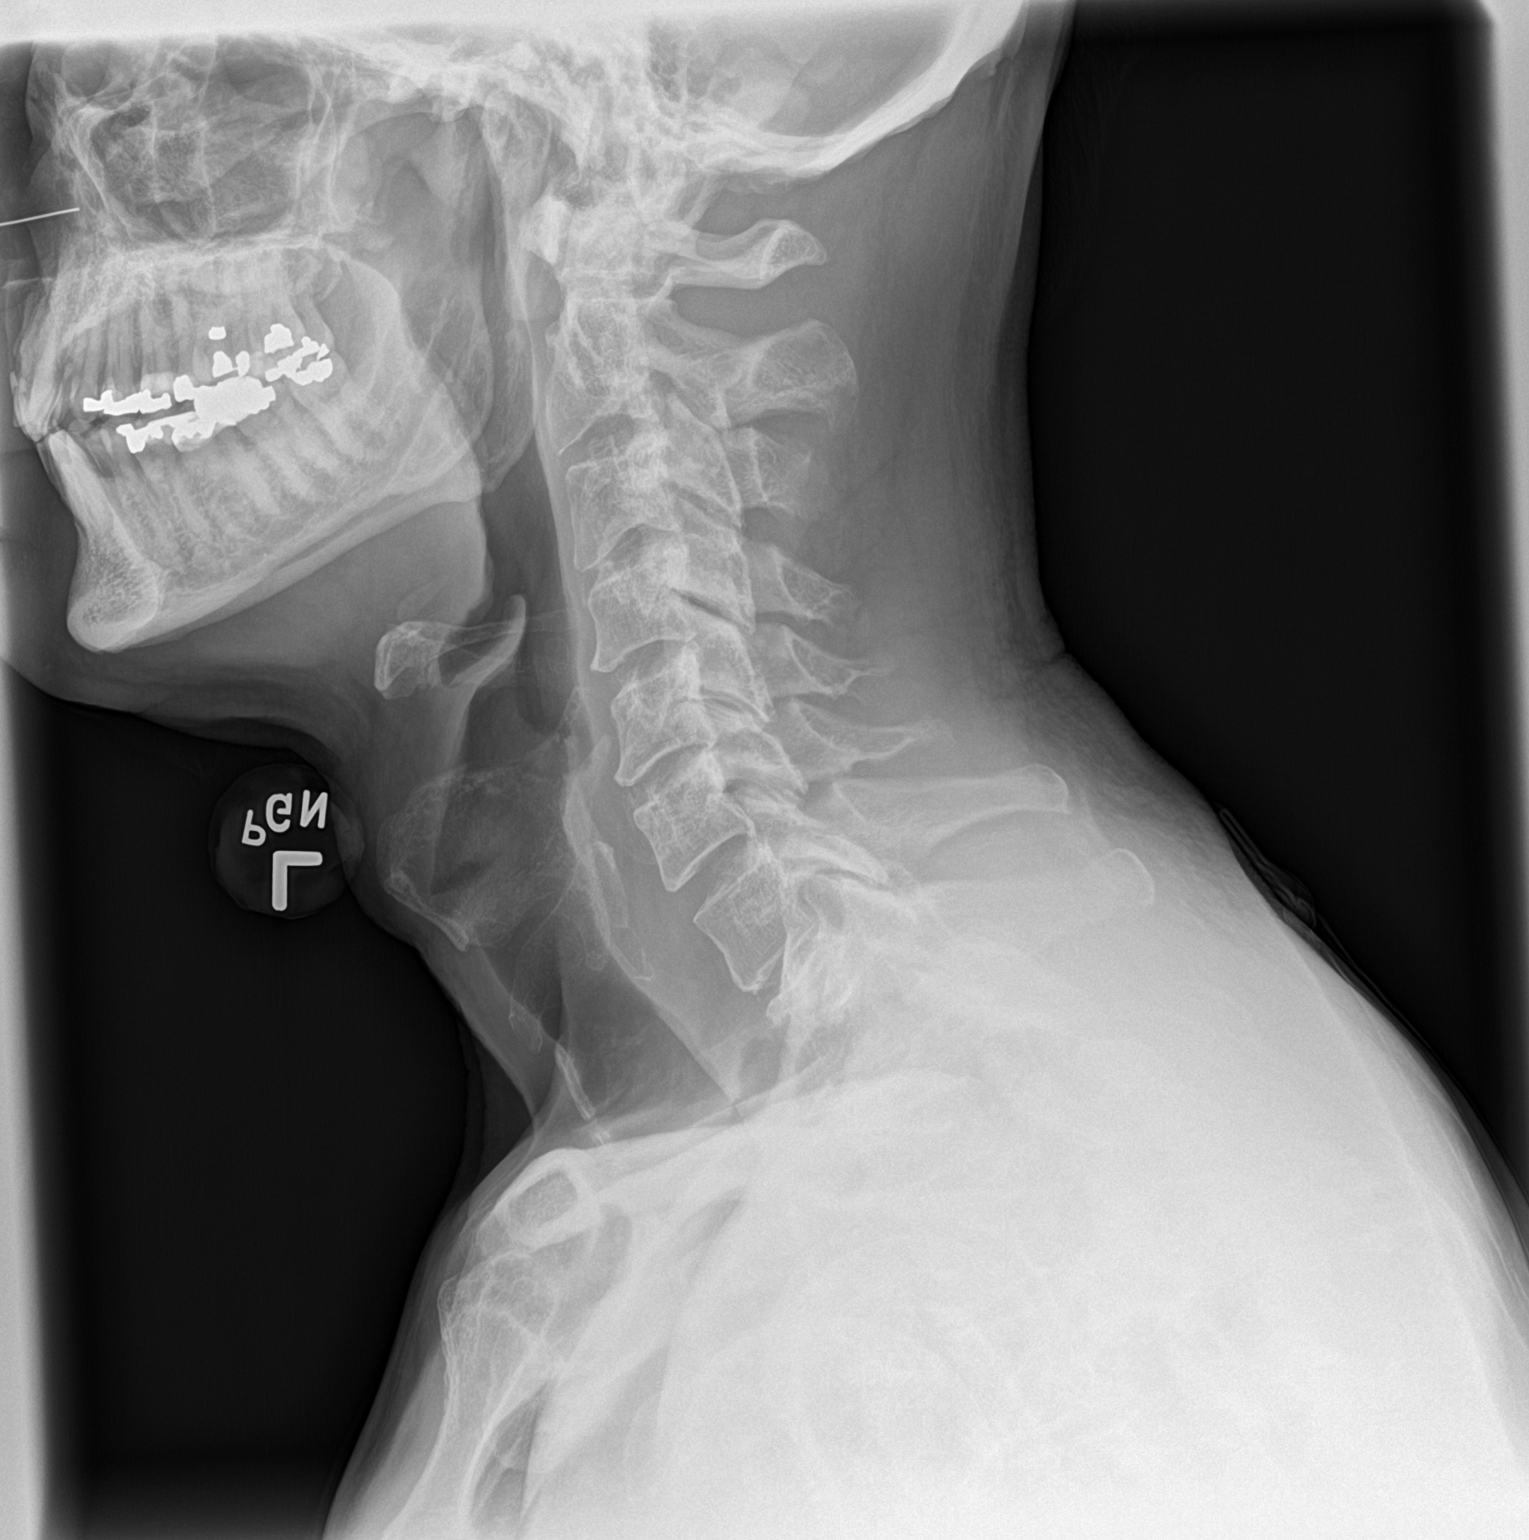

[c-spine ap]
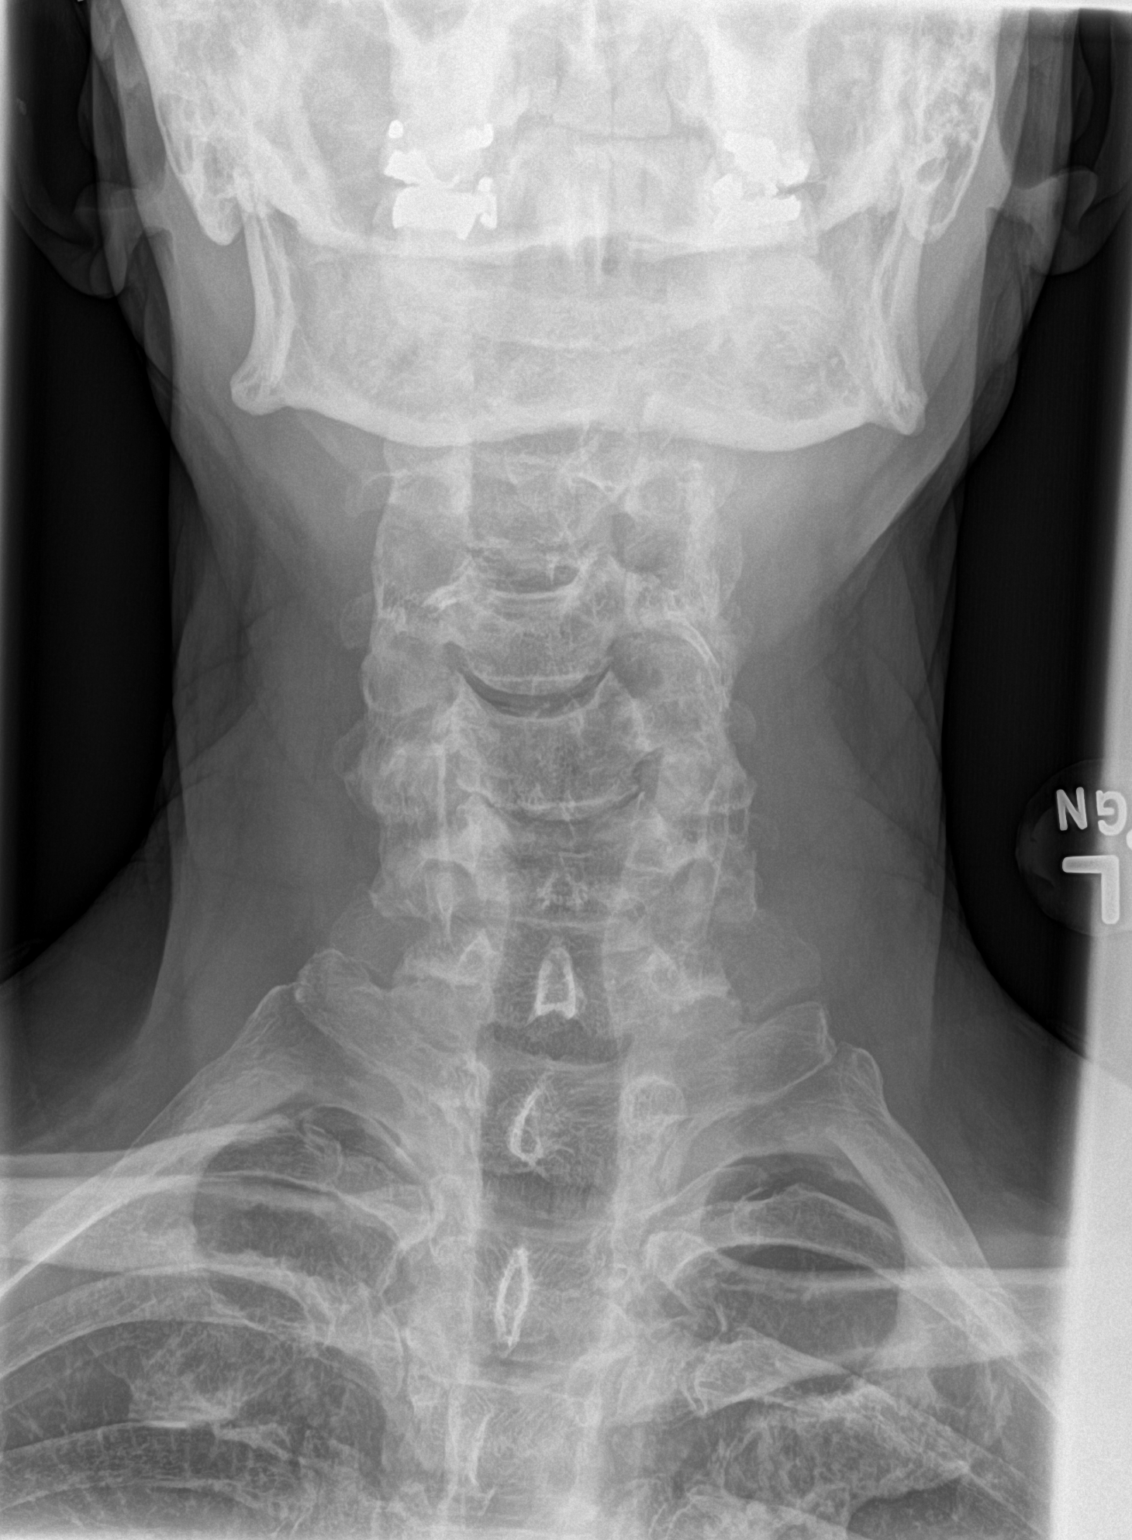

[c-spine open mouth]
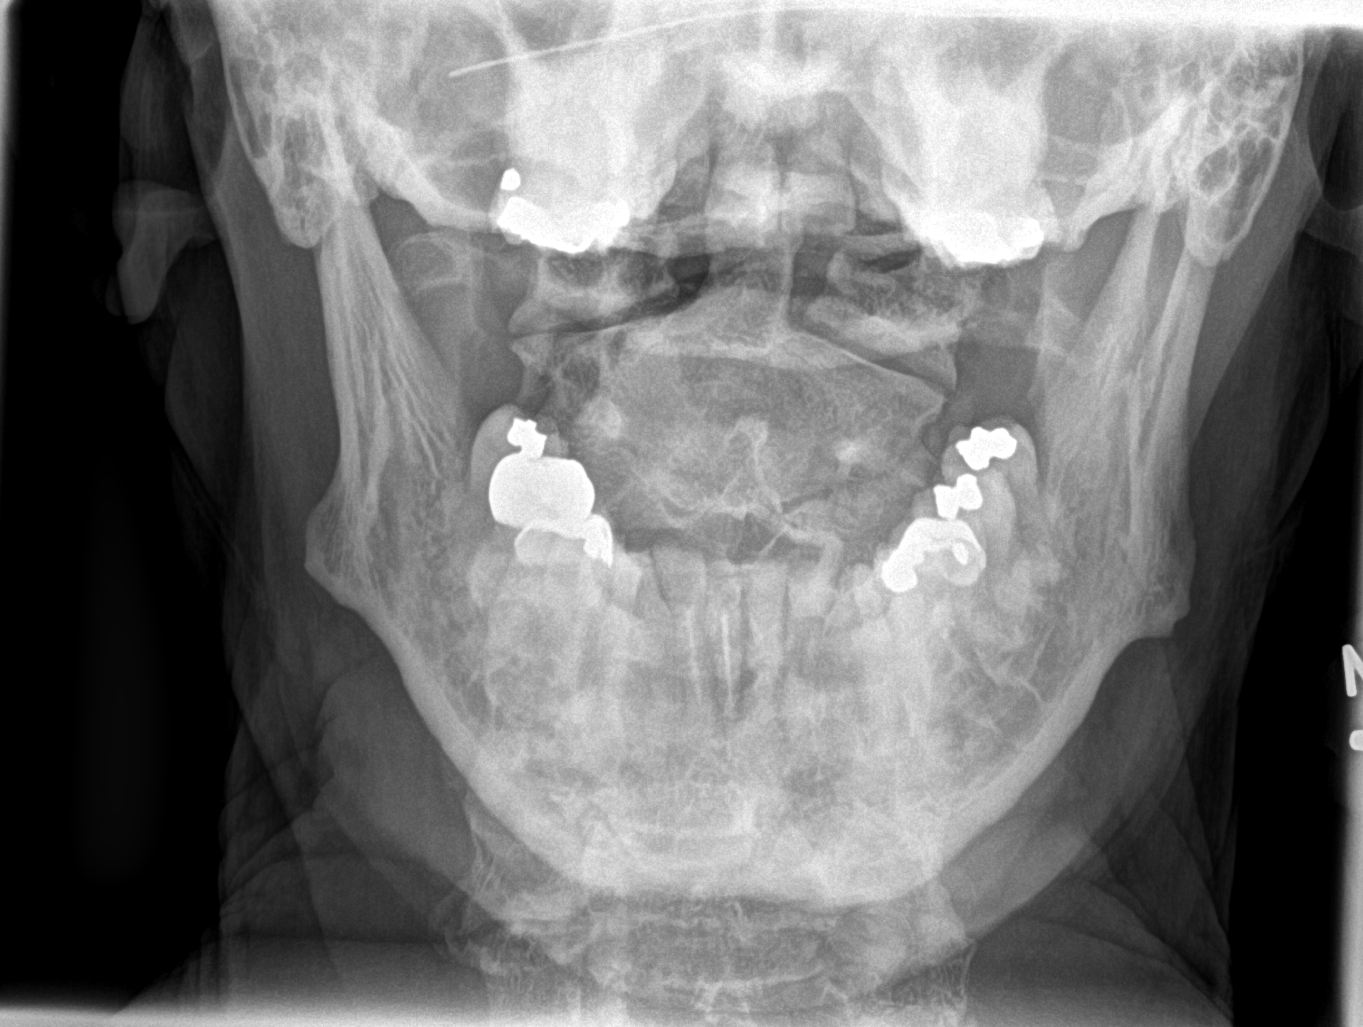

[3 of 3 positions shown; findings below may reference images not displayed]

FINDINGS: Cervical Spine:

Cervical elements maintain relative anatomic alignment from the
level of C1-T1. Unremarkable appearance of the craniocervical
junction.

No subluxation, anterolisthesis, retrolisthesis.

No acute fracture line identified.

Vertebral body heights maintained.

Mild disc space narrowing throughout the cervical spine without
significant endplate changes. Early uncovertebral joint disease
present at all levels.

Facet hypertrophy developing bilaterally, throughout the cervical
spine. Open mouth odontoid view unremarkable.

Prevertebral soft tissues within normal limits.
IMPRESSION: Negative for acute fracture or malalignment of the cervical spine.

Early degenerative changes.

## 2022-03-16 IMAGING — DX DG KNEE 3 VIEWS*R*
3 series · 3 of 3 positions shown · non-contrast
Comparison: None.

CLINICAL DATA: 69-year-old male with a history of medial knee pain
and weakness for 3 weeks

EXAM:
RIGHT KNEE - 3 VIEW

[knee ap]
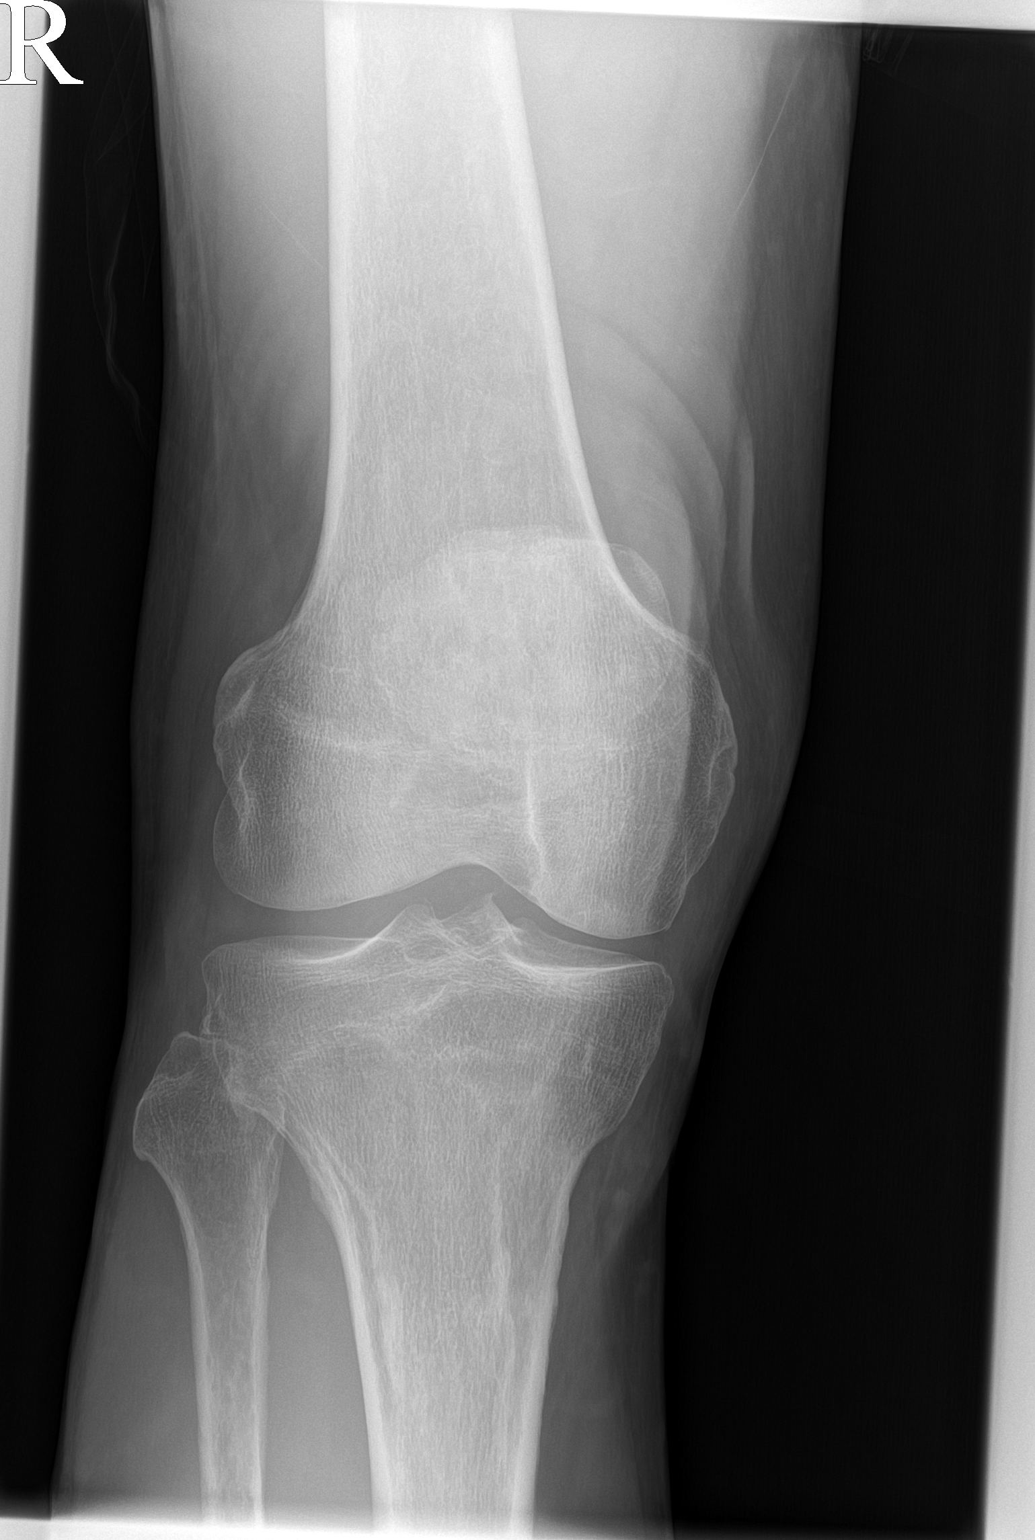

[knee lat]
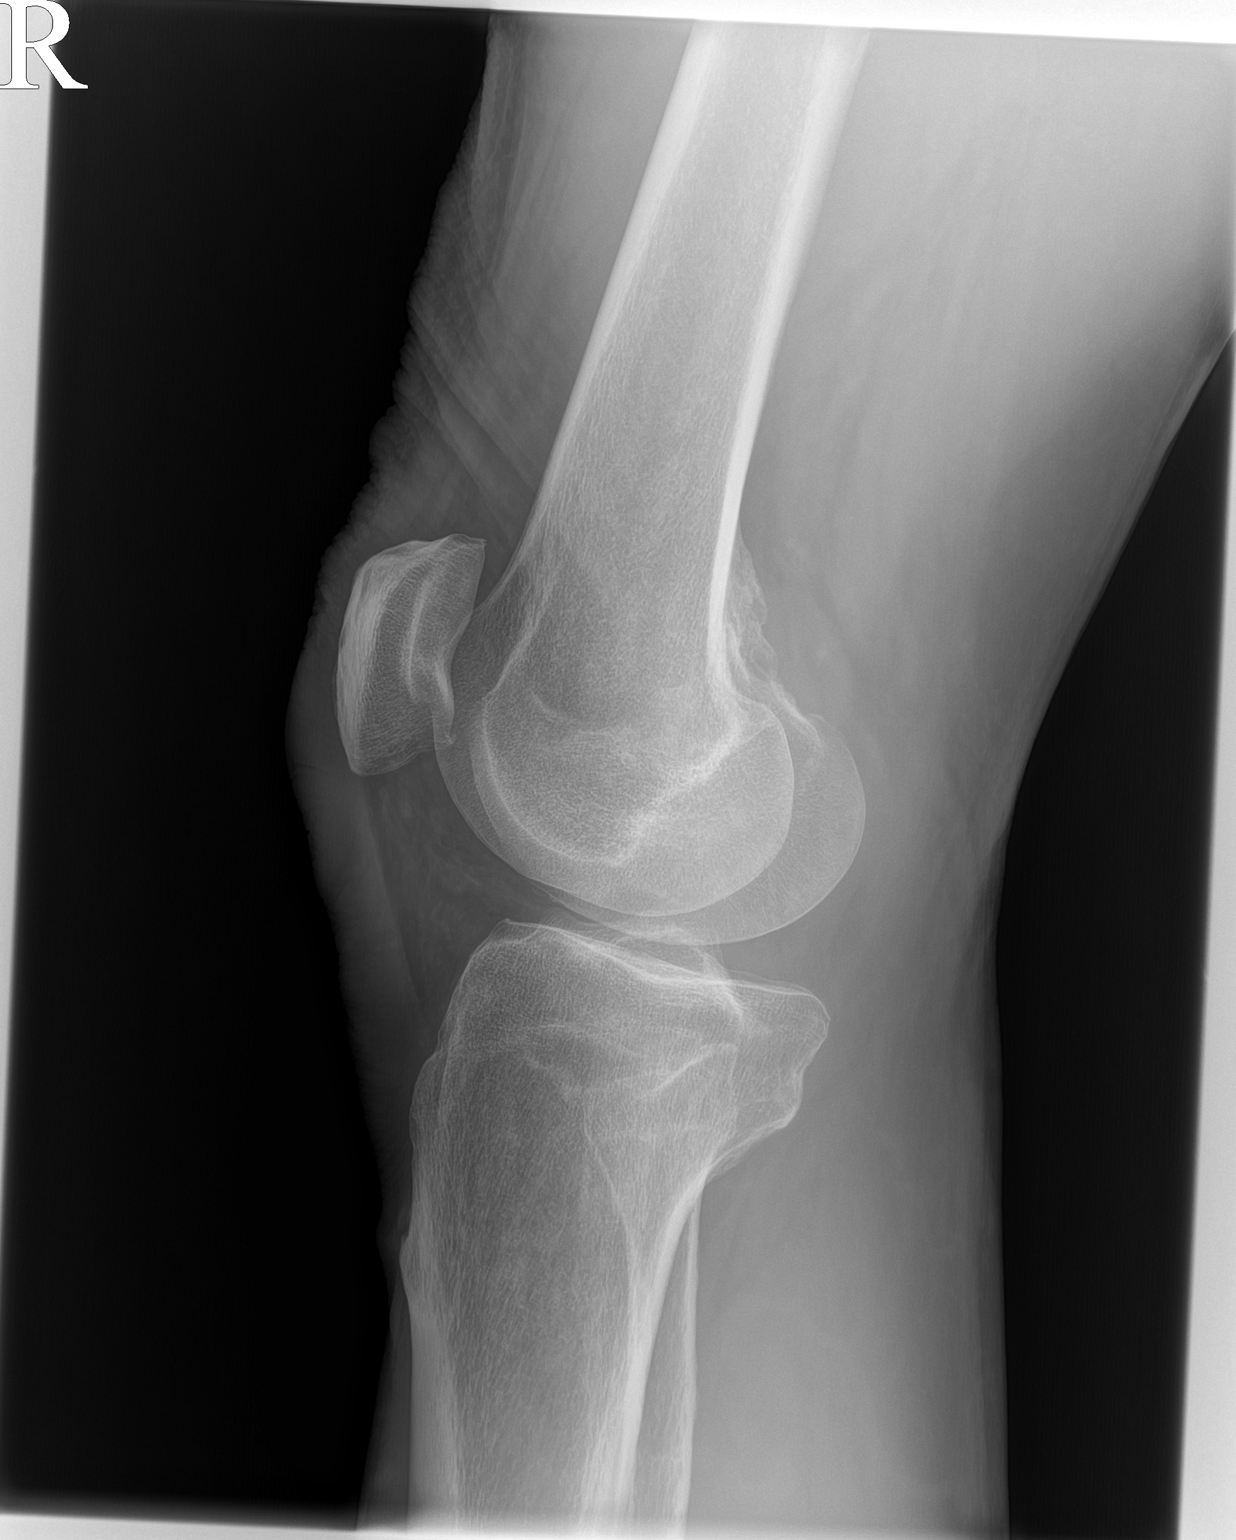

[patella]
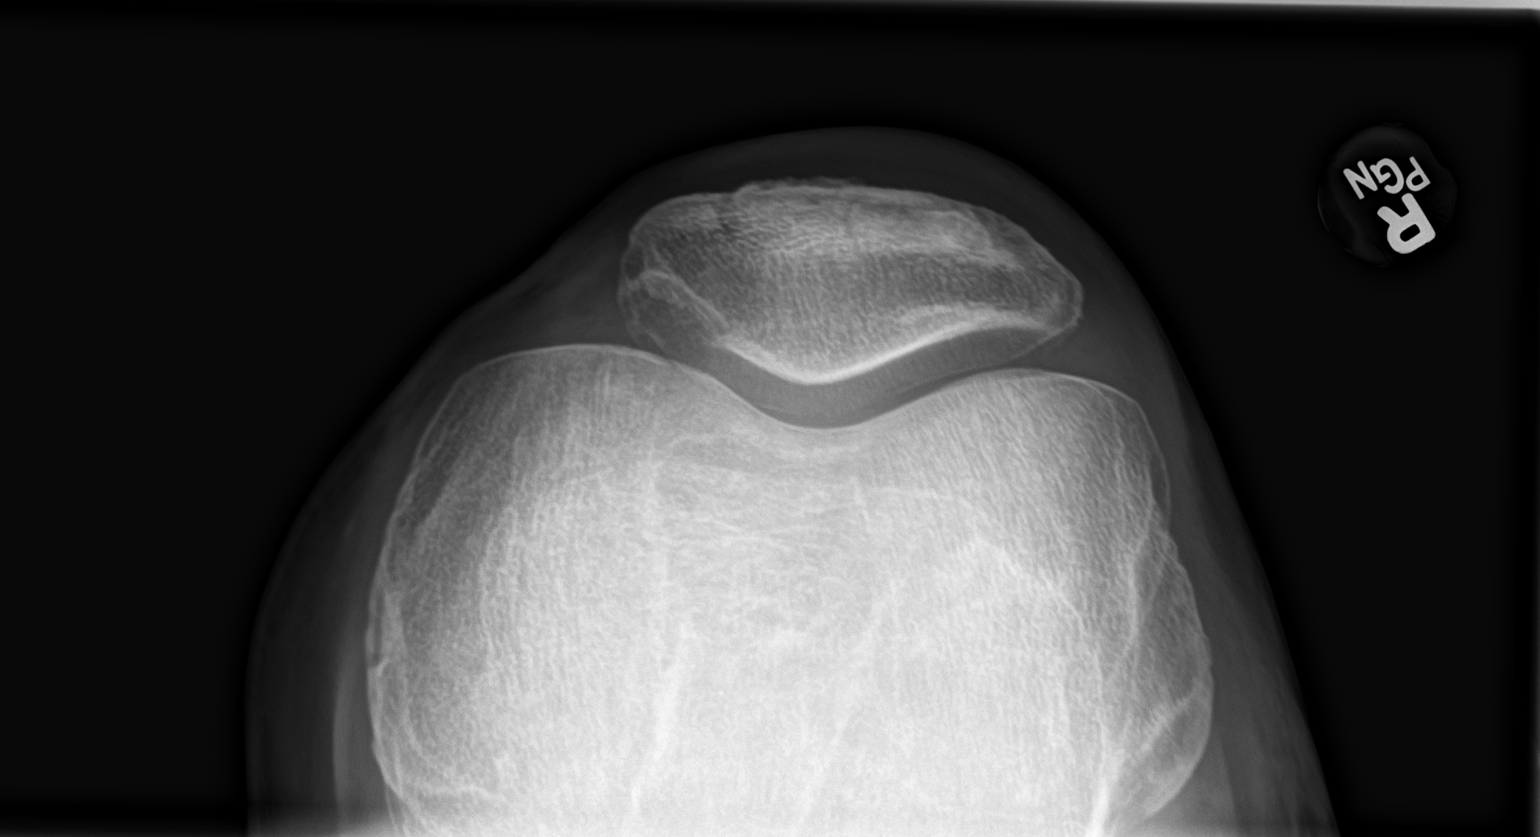

[3 of 3 positions shown; findings below may reference images not displayed]

FINDINGS: No acute displaced fracture. Mild joint space narrowing at the
medial compartment without marginal osteophyte formation. Minimal
degenerative changes at the patellofemoral joint. No joint effusion.
No radiopaque foreign body. No focal soft tissue swelling.
IMPRESSION: Negative for acute bony abnormality, with minimal degenerative
changes

## 2022-03-19 DIAGNOSIS — H5213 Myopia, bilateral: Secondary | ICD-10-CM | POA: Diagnosis not present

## 2022-04-02 DIAGNOSIS — Z794 Long term (current) use of insulin: Secondary | ICD-10-CM | POA: Diagnosis not present

## 2022-04-02 DIAGNOSIS — E1140 Type 2 diabetes mellitus with diabetic neuropathy, unspecified: Secondary | ICD-10-CM | POA: Diagnosis not present

## 2022-04-02 DIAGNOSIS — E7800 Pure hypercholesterolemia, unspecified: Secondary | ICD-10-CM | POA: Diagnosis not present

## 2022-04-08 ENCOUNTER — Encounter: Payer: Self-pay | Admitting: Gastroenterology

## 2022-05-09 DIAGNOSIS — E1140 Type 2 diabetes mellitus with diabetic neuropathy, unspecified: Secondary | ICD-10-CM | POA: Diagnosis not present

## 2022-05-09 DIAGNOSIS — Z794 Long term (current) use of insulin: Secondary | ICD-10-CM | POA: Diagnosis not present

## 2022-05-09 DIAGNOSIS — E7800 Pure hypercholesterolemia, unspecified: Secondary | ICD-10-CM | POA: Diagnosis not present

## 2022-05-10 ENCOUNTER — Encounter: Payer: Self-pay | Admitting: Gastroenterology

## 2022-05-10 ENCOUNTER — Ambulatory Visit (AMBULATORY_SURGERY_CENTER): Payer: Medicare PPO

## 2022-05-10 VITALS — Ht 71.0 in | Wt 166.0 lb

## 2022-05-10 DIAGNOSIS — Z1211 Encounter for screening for malignant neoplasm of colon: Secondary | ICD-10-CM

## 2022-05-10 MED ORDER — NA SULFATE-K SULFATE-MG SULF 17.5-3.13-1.6 GM/177ML PO SOLN: 1.0000 | Freq: Once | ORAL | 0 refills | Status: AC

## 2022-05-10 NOTE — Progress Notes (Signed)

## 2022-05-30 DIAGNOSIS — E1149 Type 2 diabetes mellitus with other diabetic neurological complication: Secondary | ICD-10-CM | POA: Diagnosis not present

## 2022-06-03 ENCOUNTER — Ambulatory Visit (AMBULATORY_SURGERY_CENTER): Payer: Medicare PPO | Admitting: Gastroenterology

## 2022-06-03 ENCOUNTER — Encounter: Payer: Self-pay | Admitting: Gastroenterology

## 2022-06-03 VITALS — BP 110/61 | HR 69 | Temp 98.4°F | Resp 10 | Ht 71.0 in | Wt 164.0 lb

## 2022-06-03 DIAGNOSIS — Z1211 Encounter for screening for malignant neoplasm of colon: Secondary | ICD-10-CM

## 2022-06-03 DIAGNOSIS — D123 Benign neoplasm of transverse colon: Secondary | ICD-10-CM

## 2022-06-03 DIAGNOSIS — R7303 Prediabetes: Secondary | ICD-10-CM | POA: Diagnosis not present

## 2022-06-03 DIAGNOSIS — I10 Essential (primary) hypertension: Secondary | ICD-10-CM | POA: Diagnosis not present

## 2022-06-03 DIAGNOSIS — K635 Polyp of colon: Secondary | ICD-10-CM | POA: Diagnosis not present

## 2022-06-03 MED ORDER — SODIUM CHLORIDE 0.9 % IV SOLN: 500.0000 mL | Freq: Once | INTRAVENOUS | Status: DC

## 2022-06-03 NOTE — Progress Notes (Signed)
Report given to PACU, vss 

## 2022-06-03 NOTE — Progress Notes (Signed)
Called to room to assist during endoscopic procedure.  Patient ID and intended procedure confirmed with present staff. Received instructions for my participation in the procedure from the performing physician.  

## 2022-06-03 NOTE — Patient Instructions (Signed)
Please read handouts provided. Continue present medications. Await pathology results. Resume previous diet.   YOU HAD AN ENDOSCOPIC PROCEDURE TODAY AT THE [facility]:   Refer to the procedure report that was given to you for any specific questions about what was found during the examination.  If the procedure report does not answer your questions, please call your gastroenterologist to clarify.  If you requested that your care partner not be given the details of your procedure findings, then the procedure report has been included in a sealed envelope for you to review at your convenience later.  YOU SHOULD EXPECT: Some feelings of bloating in the abdomen. Passage of more gas than usual.  Walking can help get rid of the air that was put into your GI tract during the procedure and reduce the bloating. If you had a lower endoscopy (such as a colonoscopy or flexible sigmoidoscopy) you may notice spotting of blood in your stool or on the toilet paper. If you underwent a bowel prep for your procedure, you may not have a normal bowel movement for a few days.  Please Note:  You might notice some irritation and congestion in your nose or some drainage.  This is from the oxygen used during your procedure.  There is no need for concern and it should clear up in a day or so.  SYMPTOMS TO REPORT IMMEDIATELY:  Following lower endoscopy (colonoscopy or flexible sigmoidoscopy):  Excessive amounts of blood in the stool  Significant tenderness or worsening of abdominal pains  Swelling of the abdomen that is new, acute  Fever of 100F or higher.  For urgent or emergent issues, a gastroenterologist can be reached at any hour by calling [phone]. Do not use MyChart messaging for urgent concerns.    DIET:  We do recommend a small meal at first, but then you may proceed to your regular diet.  Drink plenty of fluids but you should avoid alcoholic beverages for 24 hours.  ACTIVITY:  You should  plan to take it easy for the rest of today and you should NOT DRIVE or use heavy machinery until tomorrow (because of the sedation medicines used during the test).    FOLLOW UP: Our staff will call the number listed on your records the next business day following your procedure.  We will call around 7:15- 8:00 am to check on you and address any questions or concerns that you may have regarding the information given to you following your procedure. If we do not reach you, we will leave a message.     If any biopsies were taken you will be contacted by phone or by letter within the next 1-3 weeks.  Please call us at [phone] if you have not heard about the biopsies in 3 weeks.    SIGNATURES/CONFIDENTIALITY: You and/or your care partner have signed paperwork which will be entered into your electronic medical record.  These signatures attest to the fact that that the information above on your After Visit Summary has been reviewed and is understood.  Full responsibility of the confidentiality of this discharge information lies with you and/or your care-partner.  

## 2022-06-03 NOTE — Op Note (Signed)
[facility] Patient Name: Darryl Dunlap Procedure Date: 06/03/2022 10:50 AM MRN: [id number] Endoscopist: Viviann Spare P. Adela Lank , MD, [id number] Age: 72 Referring MD:  Date of Birth: 1950-07-12 Gender: Male Account #: [id number] Procedure:                Colonoscopy Indications:              Screening for colorectal malignant neoplasm Medicines:                Monitored Anesthesia Care Procedure:                Pre-Anesthesia Assessment:                           - Prior to the procedure, a History and Physical                            was performed, and patient medications and                            allergies were reviewed. The patient's tolerance of                            previous anesthesia was also reviewed. The risks                            and benefits of the procedure and the sedation                            options and risks were discussed with the patient.                            All questions were answered, and informed consent                            was obtained. Prior Anticoagulants: The patient has                            taken no anticoagulant or antiplatelet agents. ASA                            Grade Assessment: II - A patient with mild systemic                            disease. After reviewing the risks and benefits,                            the patient was deemed in satisfactory condition to                            undergo the procedure.                           After obtaining informed consent, the colonoscope  was passed under direct vision. Throughout the                            procedure, the patient's blood pressure, pulse, and                            oxygen saturations were monitored continuously. The                            Olympus SN [id number] was introduced through the anus                            and advanced to the the cecum, identified by                            appendiceal  orifice and ileocecal valve. The                            colonoscopy was performed without difficulty. The                            patient tolerated the procedure well. The quality                            of the bowel preparation was good. The ileocecal                            valve, appendiceal orifice, and rectum were                            photographed. Scope In: 11:10:18 AM Scope Out: 11:28:10 AM Scope Withdrawal Time: 0 hours 14 minutes 35 seconds  Total Procedure Duration: 0 hours 17 minutes 52 seconds  Findings:                 The perianal and digital rectal examinations were                            normal.                           A 3 mm polyp was found in the hepatic flexure. The                            polyp was sessile. The polyp was removed with a                            cold snare. Resection and retrieval were complete.                           Internal hemorrhoids were found during retroflexion.                           Anal papilla(e) were hypertrophied.  The exam was otherwise without abnormality. Complications:            No immediate complications. Estimated blood loss:                            Minimal. Estimated Blood Loss:     Estimated blood loss was minimal. Impression:               - One 3 mm polyp at the hepatic flexure, removed                            with a cold snare. Resected and retrieved.                           - Internal hemorrhoids.                           - Anal papilla(e) were hypertrophied.                           - The examination was otherwise normal.                           - The GI Genius (intelligent endoscopy module),                            computer-aided polyp detection system powered by AI                            was utilized to detect colorectal polyps through                            enhanced visualization during colonoscopy. Recommendation:           - Patient has  a contact number available for                            emergencies. The signs and symptoms of potential                            delayed complications were discussed with the                            patient. Return to normal activities tomorrow.                            Written discharge instructions were provided to the                            patient.                           - Resume previous diet.                           - Continue present medications.                           -  Await pathology results. Viviann Spare P. Jozef Eisenbeis, MD 06/03/2022 11:33:51 AM This report has been signed electronically.

## 2022-06-03 NOTE — Progress Notes (Signed)
[facility] History and Physical   Primary Care Physician:  Patient, No Pcp Per   Reason for Procedure:   Colon cancer screening  Plan:    colonoscopy     HPI: Darryl Dunlap is a 72 y.o. male  here for colonoscopy screening - normal exam 05/2006.   Patient denies any bowel symptoms at this time. No family history of colon cancer known in first degree relatives. Otherwise feels well without any cardiopulmonary symptoms.   I have discussed risks / benefits of anesthesia and endoscopic procedure with Darryl Dunlap and they wish to proceed with the exams as outlined today.    Past Medical History:  Diagnosis Date   Carpal tunnel syndrome    Diabetes mellitus without complication    Hypertension    Pre-diabetes     Past Surgical History:  Procedure Laterality Date   COLONOSCOPY  2008    Prior to Admission medications   Medication Sig Start Date End Date Taking? Authorizing Provider  aspirin (ASPIRIN 81) 81 MG chewable tablet Chew 81 mg by mouth daily.   Yes [provider]  BD PEN NEEDLE NANO 2ND GEN 32G X 4 MM MISC USE AS DIRECTED WITH INSULIN FOUR TIMES DAILY for 88 days   Yes [provider]  Cholecalciferol 25 MCG (1000 UT) capsule Take 1,000 Units by mouth daily. 12/20/08  Yes [provider]  Continuous Blood Gluc Sensor (FREESTYLE LIBRE 3 SENSOR) MISC change sensor topically every 14 days to monitor blood glucose continuously   Yes [provider]  Cyanocobalamin (B-12) 1000 MCG TABS Take 1 tablet by mouth daily. 06/12/10  Yes [provider]  glucose blood (FREESTYLE PRECISION NEO TEST) test strip Use to self monitor blood glucose 4 times daily and as needed In Vitro 05/21/16  Yes [provider]  Insulin Aspart FlexPen (NOVOLOG) 100 UNIT/ML 40-45 units depending on meals per pt   Yes [provider]  JARDIANCE 25 MG TABS tablet Take 1 tablet by mouth daily.   Yes [provider]  Darryl Dunlap  Oil 500 MG CAPS Take 500 mg by mouth daily.   Yes [provider]  Lancets (FREESTYLE) lancets Use to self monitor glucose 4x a day 06/13/16  Yes [provider]  rosuvastatin (CRESTOR) 20 MG tablet Take 1 tablet by mouth daily.   Yes [provider]  Tart Cherry 1200 MG CAPS Take 3,600 mg by mouth daily.   Yes [provider]  TRESIBA FLEXTOUCH 100 UNIT/ML FlexTouch Pen Inject 12 Units into the skin at bedtime.   Yes [provider]    Current Outpatient Medications  Medication Sig Dispense Refill   aspirin (ASPIRIN 81) 81 MG chewable tablet Chew 81 mg by mouth daily.     BD PEN NEEDLE NANO 2ND GEN 32G X 4 MM MISC USE AS DIRECTED WITH INSULIN FOUR TIMES DAILY for 88 days     Cholecalciferol 25 MCG (1000 UT) capsule Take 1,000 Units by mouth daily.     Continuous Blood Gluc Sensor (FREESTYLE LIBRE 3 SENSOR) MISC change sensor topically every 14 days to monitor blood glucose continuously     Cyanocobalamin (B-12) 1000 MCG TABS Take 1 tablet by mouth daily.     glucose blood (FREESTYLE PRECISION NEO TEST) test strip Use to self monitor blood glucose 4 times daily and as needed In Vitro     Insulin Aspart FlexPen (NOVOLOG) 100 UNIT/ML 40-45 units depending on meals per pt     JARDIANCE 25  MG TABS tablet Take 1 tablet by mouth daily.     Krill Oil 500 MG CAPS Take 500 mg by mouth daily.     Lancets (FREESTYLE) lancets Use to self monitor glucose 4x a day     rosuvastatin (CRESTOR) 20 MG tablet Take 1 tablet by mouth daily.     Tart Cherry 1200 MG CAPS Take 3,600 mg by mouth daily.     TRESIBA FLEXTOUCH 100 UNIT/ML FlexTouch Pen Inject 12 Units into the skin at bedtime.     No current facility-administered medications for this visit.    Allergies as of 06/03/2022 - Review Complete 06/03/2022  Allergen Reaction Noted   Penicillins  10/14/2010    Family History  Problem Relation Age of Onset   Colon cancer Maternal Grandfather 14 - 70   Colon  polyps Neg Hx    Esophageal cancer Neg Hx    Rectal cancer Neg Hx    Stomach cancer Neg Hx     Social History   Socioeconomic History   Marital status: Married    Spouse name: Not on file   Number of children: Not on file   Years of education: Not on file   Highest education level: Not on file  Occupational History   Not on file  Tobacco Use   Smoking status: Never   Smokeless tobacco: Never  Vaping Use   Vaping Use: Never used  Substance and Sexual Activity   Alcohol use: Yes    Alcohol/week: 2.0 standard drinks of alcohol    Types: 1 Glasses of wine, 1 Cans of beer per week    Comment: rare   Drug use: No   Sexual activity: Not on file  Other Topics Concern   Not on file  Social History Narrative   Not on file   Social Determinants of Health   Financial Resource Strain: Not on file  Food Insecurity: Not on file  Transportation Needs: Not on file  Physical Activity: Not on file  Stress: Not on file  Social Connections: Not on file  Intimate Partner Violence: Not on file    Review of Systems: All other review of systems negative except as mentioned in the HPI.  Physical Exam: Vital signs BP 129/79 (BP Location: Right Arm, Patient Position: Sitting, Cuff Size: Normal)   Pulse 70   Temp 98.4 F (36.9 C) (Temporal)   Ht  (1.803 m)   Wt 164 lb (74.4 kg)   SpO2 99%   BMI 22.87 kg/m   General:   Alert,  Well-developed, pleasant and cooperative in NAD Lungs:  Clear throughout to auscultation.   Heart:  Regular rate and rhythm Abdomen:  Soft, nontender and nondistended.   Neuro/Psych:  Alert and cooperative. Normal mood and affect. A and O x 3  Darryl Rain, MD [facility]

## 2022-06-04 ENCOUNTER — Telehealth: Payer: Self-pay | Admitting: *Deleted

## 2022-06-04 NOTE — Telephone Encounter (Signed)
Post procedure follow up call placed, no answer and left VM.  

## 2022-07-08 DIAGNOSIS — Z794 Long term (current) use of insulin: Secondary | ICD-10-CM | POA: Diagnosis not present

## 2022-07-08 DIAGNOSIS — G629 Polyneuropathy, unspecified: Secondary | ICD-10-CM | POA: Diagnosis not present

## 2022-07-08 DIAGNOSIS — F5102 Adjustment insomnia: Secondary | ICD-10-CM | POA: Diagnosis not present

## 2022-07-08 DIAGNOSIS — D692 Other nonthrombocytopenic purpura: Secondary | ICD-10-CM | POA: Diagnosis not present

## 2022-07-08 DIAGNOSIS — E7800 Pure hypercholesterolemia, unspecified: Secondary | ICD-10-CM | POA: Diagnosis not present

## 2022-07-08 DIAGNOSIS — M47892 Other spondylosis, cervical region: Secondary | ICD-10-CM | POA: Diagnosis not present

## 2022-07-08 DIAGNOSIS — N529 Male erectile dysfunction, unspecified: Secondary | ICD-10-CM | POA: Diagnosis not present

## 2022-07-08 DIAGNOSIS — E1140 Type 2 diabetes mellitus with diabetic neuropathy, unspecified: Secondary | ICD-10-CM | POA: Diagnosis not present

## 2022-09-03 DIAGNOSIS — E1149 Type 2 diabetes mellitus with other diabetic neurological complication: Secondary | ICD-10-CM | POA: Diagnosis not present

## 2022-09-10 DIAGNOSIS — E7800 Pure hypercholesterolemia, unspecified: Secondary | ICD-10-CM | POA: Diagnosis not present

## 2022-09-10 DIAGNOSIS — E119 Type 2 diabetes mellitus without complications: Secondary | ICD-10-CM | POA: Diagnosis not present

## 2022-09-10 DIAGNOSIS — E1140 Type 2 diabetes mellitus with diabetic neuropathy, unspecified: Secondary | ICD-10-CM | POA: Diagnosis not present

## 2022-09-10 DIAGNOSIS — Z794 Long term (current) use of insulin: Secondary | ICD-10-CM | POA: Diagnosis not present

## 2022-09-12 NOTE — Progress Notes (Signed)
[facility] [address] [place] [address] Phone: [phone] Subjective:   Darryl Dunlap, am serving as a scribe for Dr. Antoine Primas.  I'm seeing this patient by the request  of:  Patient, No Pcp Per  CC: Chronic pain, pelvic pain  DDU:[id number]  Darryl Dunlap is a 72 y.o. male coming in with complaint of R thumb and arm numbness. Patient states that 3 months ago he started to get cramps and has been having tingling in thumb and R arm as well. Most of the time he does not notice pain but driving and shaving increase his pain. Does not have pain with golfing. Denies any neck pain. No hx of neck issues.   Did have neck x-rays in 2021 and severe.    Past Medical History:  Diagnosis Date   Carpal tunnel syndrome    Diabetes mellitus without complication (HCC)    Hypertension    Pre-diabetes    Past Surgical History:  Procedure Laterality Date   COLONOSCOPY  2008   Social History   Socioeconomic History   Marital status: Married    Spouse name: Not on file   Number of children: Not on file   Years of education: Not on file   Highest education level: Not on file  Occupational History   Not on file  Tobacco Use   Smoking status: Never   Smokeless tobacco: Never  Vaping Use   Vaping status: Never Used  Substance and Sexual Activity   Alcohol use: Yes    Alcohol/week: 2.0 standard drinks of alcohol    Types: 1 Glasses of wine, 1 Cans of beer per week    Comment: rare   Drug use: No   Sexual activity: Not on file  Other Topics Concern   Not on file  Social History Narrative   Not on file   Social Determinants of Health   Financial Resource Strain: Not on file  Food Insecurity: Not on file  Transportation Needs: Not on file  Physical Activity: Not on file  Stress: Not on file  Social Connections: Not on file   Allergies  Allergen Reactions   Penicillins    Family History  Problem Relation Age of Onset   Colon  cancer Maternal Grandfather 24 - 70   Colon polyps Neg Hx    Esophageal cancer Neg Hx    Rectal cancer Neg Hx    Stomach cancer Neg Hx     Current Outpatient Medications (Endocrine & Metabolic):    Insulin Aspart FlexPen (NOVOLOG) 100 UNIT/ML, 40-45 units depending on meals per pt   JARDIANCE 25 MG TABS tablet, Take 1 tablet by mouth daily.   TRESIBA FLEXTOUCH 100 UNIT/ML FlexTouch Pen, Inject 12 Units into the skin at bedtime.  Current Outpatient Medications (Cardiovascular):    rosuvastatin (CRESTOR) 20 MG tablet, Take 1 tablet by mouth daily.   Current Outpatient Medications (Analgesics):    aspirin (ASPIRIN 81) 81 MG chewable tablet, Chew 81 mg by mouth daily.  Current Outpatient Medications (Hematological):    Cyanocobalamin (B-12) 1000 MCG TABS, Take 1 tablet by mouth daily.  Current Outpatient Medications (Other):    BD PEN NEEDLE NANO 2ND GEN 32G X 4 MM MISC, USE AS DIRECTED WITH INSULIN FOUR TIMES DAILY for 88 days   Cholecalciferol 25 MCG (1000 UT) capsule, Take 1,000 Units by mouth daily.   Continuous Blood Gluc Sensor (FREESTYLE LIBRE 3 SENSOR) MISC, change sensor topically every 14 days to  monitor blood glucose continuously   glucose blood (FREESTYLE PRECISION NEO TEST) test strip, Use to self monitor blood glucose 4 times daily and as needed In Vitro   Krill Oil 500 MG CAPS, Take 500 mg by mouth daily.   Lancets (FREESTYLE) lancets, Use to self monitor glucose 4x a day   Tart Cherry 1200 MG CAPS, Take 3,600 mg by mouth daily.   Reviewed prior external information including notes and imaging from  primary care provider As well as notes that were available from care everywhere and other healthcare systems.  Past medical history, social, surgical and family history all reviewed in electronic medical record.  No pertanent information unless stated regarding to the chief complaint.   Review of Systems:  No headache, visual changes, nausea, vomiting, diarrhea,  constipation, dizziness, abdominal pain, skin rash, fevers, chills, night sweats, weight loss, swollen lymph nodes, body aches, joint swelling, chest pain, shortness of breath, mood changes. POSITIVE muscle aches  Objective  There were no vitals taken for this visit.   General: No apparent distress alert and oriented x3 mood and affect normal, dressed appropriately.  HEENT: Pupils equal, extraocular movements intact  Respiratory: Patient's speak in full sentences and does not appear short of breath  Cardiovascular: No lower extremity edema, non tender, no erythema  Neck exam does have some loss lordosis.  Some tenderness to palpation.  Positive Spurling's on the right side.  Does have significant limitation of the right side of the neck with sidebending and extension. Wrist exam shows full strength noted.  Negative Tinel's.  Limited muscular skeletal ultrasound was performed and interpreted by Antoine Primas, M  Limited ultrasound of patient's wrist does not show any significant carpal tunnel.  CMC joint does have very mild arthritic changes but otherwise fairly not. Impression: Mild CMC arthritis but otherwise unremarkable.  [id number]; 15 additional minutes spent for Therapeutic exercises as stated in above notes.  This included exercises focusing on stretching, strengthening, with significant focus on eccentric aspects.   Long term goals include an improvement in range of motion, strength, endurance as well as avoiding reinjury. Patient's frequency would include in 1-2 times a day, 3-5 times a week for a duration of 6-12 weeks. Exercises that included:  Basic scapular stabilization to include adduction and depression of scapula Scaption, focusing on proper movement and good control Internal and External rotation utilizing a theraband, with elbow tucked at side entire time Rows with theraband    Proper technique shown and discussed handout in great detail with ATC.  All questions were discussed and  answered.      Impression and Recommendations:    The above documentation has been reviewed and is accurate and complete Judi Saa, DO

## 2022-09-13 ENCOUNTER — Other Ambulatory Visit: Payer: Self-pay

## 2022-09-13 ENCOUNTER — Encounter: Payer: Self-pay | Admitting: Family Medicine

## 2022-09-13 ENCOUNTER — Ambulatory Visit: Payer: Medicare PPO | Admitting: Family Medicine

## 2022-09-13 VITALS — BP 120/72 | HR 76 | Ht 71.0 in | Wt 168.0 lb

## 2022-09-13 DIAGNOSIS — M5010 Cervical disc disorder with radiculopathy, unspecified cervical region: Secondary | ICD-10-CM

## 2022-09-13 DIAGNOSIS — M79644 Pain in right finger(s): Secondary | ICD-10-CM

## 2022-09-13 MED ORDER — GABAPENTIN 100 MG PO CAPS: 200.0000 mg | ORAL_CAPSULE | Freq: Every day | ORAL | 0 refills | Status: DC

## 2022-09-13 NOTE — Assessment & Plan Note (Signed)
X-ray shows severe facet arthropathy of the neck.  Patient does have a positive Spurling's noted.  Discussed with patient though that there is no significant weakness noted had not started gabapentin well secondary to the radicular symptoms.  Discussed icing regimen, posture and ergonomics and work with Event organiser.  Will get see how patient responds and depending on this and decide if any advanced imaging is warranted especially if there is any weakness or if this numbness becomes more constant.  Follow-up with me again 6 to 8 weeks

## 2022-09-13 NOTE — Patient Instructions (Signed)
Gabapentin 200mg  at night Exercises See me in 6-8 weeks

## 2022-11-08 ENCOUNTER — Ambulatory Visit: Payer: Medicare PPO | Admitting: Family Medicine

## 2022-11-15 DIAGNOSIS — E1140 Type 2 diabetes mellitus with diabetic neuropathy, unspecified: Secondary | ICD-10-CM | POA: Diagnosis not present

## 2022-11-15 DIAGNOSIS — D692 Other nonthrombocytopenic purpura: Secondary | ICD-10-CM | POA: Diagnosis not present

## 2022-11-15 DIAGNOSIS — Z125 Encounter for screening for malignant neoplasm of prostate: Secondary | ICD-10-CM | POA: Diagnosis not present

## 2022-11-15 DIAGNOSIS — Z1212 Encounter for screening for malignant neoplasm of rectum: Secondary | ICD-10-CM | POA: Diagnosis not present

## 2022-11-15 DIAGNOSIS — E785 Hyperlipidemia, unspecified: Secondary | ICD-10-CM | POA: Diagnosis not present

## 2022-11-22 DIAGNOSIS — Z1331 Encounter for screening for depression: Secondary | ICD-10-CM | POA: Diagnosis not present

## 2022-11-22 DIAGNOSIS — G629 Polyneuropathy, unspecified: Secondary | ICD-10-CM | POA: Diagnosis not present

## 2022-11-22 DIAGNOSIS — Z0000 Encounter for general adult medical examination without abnormal findings: Secondary | ICD-10-CM | POA: Diagnosis not present

## 2022-11-22 DIAGNOSIS — E1140 Type 2 diabetes mellitus with diabetic neuropathy, unspecified: Secondary | ICD-10-CM | POA: Diagnosis not present

## 2022-11-22 DIAGNOSIS — N529 Male erectile dysfunction, unspecified: Secondary | ICD-10-CM | POA: Diagnosis not present

## 2022-11-22 DIAGNOSIS — D692 Other nonthrombocytopenic purpura: Secondary | ICD-10-CM | POA: Diagnosis not present

## 2022-11-22 DIAGNOSIS — F5102 Adjustment insomnia: Secondary | ICD-10-CM | POA: Diagnosis not present

## 2022-11-22 DIAGNOSIS — E7800 Pure hypercholesterolemia, unspecified: Secondary | ICD-10-CM | POA: Diagnosis not present

## 2022-11-22 DIAGNOSIS — Z1339 Encounter for screening examination for other mental health and behavioral disorders: Secondary | ICD-10-CM | POA: Diagnosis not present

## 2022-11-22 DIAGNOSIS — Z794 Long term (current) use of insulin: Secondary | ICD-10-CM | POA: Diagnosis not present

## 2022-11-22 DIAGNOSIS — Z23 Encounter for immunization: Secondary | ICD-10-CM | POA: Diagnosis not present

## 2022-11-22 DIAGNOSIS — R82998 Other abnormal findings in urine: Secondary | ICD-10-CM | POA: Diagnosis not present

## 2022-12-03 DIAGNOSIS — E1149 Type 2 diabetes mellitus with other diabetic neurological complication: Secondary | ICD-10-CM | POA: Diagnosis not present

## 2022-12-09 ENCOUNTER — Other Ambulatory Visit: Payer: Self-pay | Admitting: Family Medicine

## 2023-01-28 DIAGNOSIS — Z794 Long term (current) use of insulin: Secondary | ICD-10-CM | POA: Diagnosis not present

## 2023-01-28 DIAGNOSIS — E1140 Type 2 diabetes mellitus with diabetic neuropathy, unspecified: Secondary | ICD-10-CM | POA: Diagnosis not present

## 2023-01-28 DIAGNOSIS — E7800 Pure hypercholesterolemia, unspecified: Secondary | ICD-10-CM | POA: Diagnosis not present

## 2023-01-30 DIAGNOSIS — H10021 Other mucopurulent conjunctivitis, right eye: Secondary | ICD-10-CM | POA: Diagnosis not present

## 2023-03-17 DIAGNOSIS — E1149 Type 2 diabetes mellitus with other diabetic neurological complication: Secondary | ICD-10-CM | POA: Diagnosis not present

## 2023-03-28 DIAGNOSIS — X32XXXD Exposure to sunlight, subsequent encounter: Secondary | ICD-10-CM | POA: Diagnosis not present

## 2023-03-28 DIAGNOSIS — C4362 Malignant melanoma of left upper limb, including shoulder: Secondary | ICD-10-CM | POA: Diagnosis not present

## 2023-03-28 DIAGNOSIS — L570 Actinic keratosis: Secondary | ICD-10-CM | POA: Diagnosis not present

## 2023-03-28 DIAGNOSIS — Z1283 Encounter for screening for malignant neoplasm of skin: Secondary | ICD-10-CM | POA: Diagnosis not present

## 2023-03-28 DIAGNOSIS — D225 Melanocytic nevi of trunk: Secondary | ICD-10-CM | POA: Diagnosis not present

## 2023-04-18 DIAGNOSIS — E7800 Pure hypercholesterolemia, unspecified: Secondary | ICD-10-CM | POA: Diagnosis not present

## 2023-04-18 DIAGNOSIS — C4362 Malignant melanoma of left upper limb, including shoulder: Secondary | ICD-10-CM | POA: Diagnosis not present

## 2023-04-18 DIAGNOSIS — E1140 Type 2 diabetes mellitus with diabetic neuropathy, unspecified: Secondary | ICD-10-CM | POA: Diagnosis not present

## 2023-04-18 DIAGNOSIS — F5102 Adjustment insomnia: Secondary | ICD-10-CM | POA: Diagnosis not present

## 2023-04-18 DIAGNOSIS — Z794 Long term (current) use of insulin: Secondary | ICD-10-CM | POA: Diagnosis not present

## 2023-04-18 DIAGNOSIS — G629 Polyneuropathy, unspecified: Secondary | ICD-10-CM | POA: Diagnosis not present

## 2023-04-18 DIAGNOSIS — D692 Other nonthrombocytopenic purpura: Secondary | ICD-10-CM | POA: Diagnosis not present

## 2023-04-18 DIAGNOSIS — N529 Male erectile dysfunction, unspecified: Secondary | ICD-10-CM | POA: Diagnosis not present

## 2023-04-18 DIAGNOSIS — M47892 Other spondylosis, cervical region: Secondary | ICD-10-CM | POA: Diagnosis not present

## 2023-04-23 DIAGNOSIS — D485 Neoplasm of uncertain behavior of skin: Secondary | ICD-10-CM | POA: Diagnosis not present

## 2023-04-25 DIAGNOSIS — C4362 Malignant melanoma of left upper limb, including shoulder: Secondary | ICD-10-CM | POA: Diagnosis not present

## 2023-06-16 DIAGNOSIS — E1149 Type 2 diabetes mellitus with other diabetic neurological complication: Secondary | ICD-10-CM | POA: Diagnosis not present

## 2023-06-27 DIAGNOSIS — H52223 Regular astigmatism, bilateral: Secondary | ICD-10-CM | POA: Diagnosis not present

## 2023-06-27 DIAGNOSIS — H18513 Endothelial corneal dystrophy, bilateral: Secondary | ICD-10-CM | POA: Diagnosis not present

## 2023-06-27 DIAGNOSIS — H524 Presbyopia: Secondary | ICD-10-CM | POA: Diagnosis not present

## 2023-07-01 DIAGNOSIS — E1140 Type 2 diabetes mellitus with diabetic neuropathy, unspecified: Secondary | ICD-10-CM | POA: Diagnosis not present

## 2023-07-01 DIAGNOSIS — E7800 Pure hypercholesterolemia, unspecified: Secondary | ICD-10-CM | POA: Diagnosis not present

## 2023-07-01 DIAGNOSIS — Z794 Long term (current) use of insulin: Secondary | ICD-10-CM | POA: Diagnosis not present

## 2023-08-05 DIAGNOSIS — Z794 Long term (current) use of insulin: Secondary | ICD-10-CM | POA: Diagnosis not present

## 2023-08-05 DIAGNOSIS — E1140 Type 2 diabetes mellitus with diabetic neuropathy, unspecified: Secondary | ICD-10-CM | POA: Diagnosis not present

## 2023-08-05 DIAGNOSIS — G629 Polyneuropathy, unspecified: Secondary | ICD-10-CM | POA: Diagnosis not present

## 2023-08-05 DIAGNOSIS — M47892 Other spondylosis, cervical region: Secondary | ICD-10-CM | POA: Diagnosis not present

## 2023-08-05 DIAGNOSIS — N529 Male erectile dysfunction, unspecified: Secondary | ICD-10-CM | POA: Diagnosis not present

## 2023-08-05 DIAGNOSIS — E7800 Pure hypercholesterolemia, unspecified: Secondary | ICD-10-CM | POA: Diagnosis not present

## 2023-08-05 DIAGNOSIS — F5102 Adjustment insomnia: Secondary | ICD-10-CM | POA: Diagnosis not present

## 2023-09-17 DIAGNOSIS — E1149 Type 2 diabetes mellitus with other diabetic neurological complication: Secondary | ICD-10-CM | POA: Diagnosis not present

## 2023-10-07 DIAGNOSIS — Z794 Long term (current) use of insulin: Secondary | ICD-10-CM | POA: Diagnosis not present

## 2023-10-07 DIAGNOSIS — E1140 Type 2 diabetes mellitus with diabetic neuropathy, unspecified: Secondary | ICD-10-CM | POA: Diagnosis not present

## 2023-10-07 DIAGNOSIS — E7800 Pure hypercholesterolemia, unspecified: Secondary | ICD-10-CM | POA: Diagnosis not present

## 2023-10-07 DIAGNOSIS — G629 Polyneuropathy, unspecified: Secondary | ICD-10-CM | POA: Diagnosis not present

## 2023-11-24 DIAGNOSIS — Z125 Encounter for screening for malignant neoplasm of prostate: Secondary | ICD-10-CM | POA: Diagnosis not present

## 2023-11-24 DIAGNOSIS — Z1212 Encounter for screening for malignant neoplasm of rectum: Secondary | ICD-10-CM | POA: Diagnosis not present

## 2023-11-25 DIAGNOSIS — E1140 Type 2 diabetes mellitus with diabetic neuropathy, unspecified: Secondary | ICD-10-CM | POA: Diagnosis not present

## 2023-11-25 DIAGNOSIS — Z794 Long term (current) use of insulin: Secondary | ICD-10-CM | POA: Diagnosis not present

## 2023-11-25 DIAGNOSIS — E7800 Pure hypercholesterolemia, unspecified: Secondary | ICD-10-CM | POA: Diagnosis not present

## 2023-12-01 DIAGNOSIS — Z1331 Encounter for screening for depression: Secondary | ICD-10-CM | POA: Diagnosis not present

## 2023-12-01 DIAGNOSIS — G629 Polyneuropathy, unspecified: Secondary | ICD-10-CM | POA: Diagnosis not present

## 2023-12-01 DIAGNOSIS — F5102 Adjustment insomnia: Secondary | ICD-10-CM | POA: Diagnosis not present

## 2023-12-01 DIAGNOSIS — E1140 Type 2 diabetes mellitus with diabetic neuropathy, unspecified: Secondary | ICD-10-CM | POA: Diagnosis not present

## 2023-12-01 DIAGNOSIS — N529 Male erectile dysfunction, unspecified: Secondary | ICD-10-CM | POA: Diagnosis not present

## 2023-12-01 DIAGNOSIS — Z1339 Encounter for screening examination for other mental health and behavioral disorders: Secondary | ICD-10-CM | POA: Diagnosis not present

## 2023-12-01 DIAGNOSIS — Z23 Encounter for immunization: Secondary | ICD-10-CM | POA: Diagnosis not present

## 2023-12-01 DIAGNOSIS — Z0000 Encounter for general adult medical examination without abnormal findings: Secondary | ICD-10-CM | POA: Diagnosis not present

## 2023-12-01 DIAGNOSIS — Z794 Long term (current) use of insulin: Secondary | ICD-10-CM | POA: Diagnosis not present

## 2023-12-01 DIAGNOSIS — E7800 Pure hypercholesterolemia, unspecified: Secondary | ICD-10-CM | POA: Diagnosis not present

## 2023-12-16 DIAGNOSIS — E1149 Type 2 diabetes mellitus with other diabetic neurological complication: Secondary | ICD-10-CM | POA: Diagnosis not present
